# Patient Record
Sex: Male | Born: 1960 | Race: White | Hispanic: No | Marital: Married | Smoking: Never smoker
Health system: Southern US, Community
[De-identification: ages and names within clinical notes are randomized; demographics above are authoritative.]

## PROBLEM LIST (undated history)

## (undated) DIAGNOSIS — E162 Hypoglycemia, unspecified: Secondary | ICD-10-CM

## (undated) HISTORY — PX: VASECTOMY: SHX75

---

## 2011-01-05 ENCOUNTER — Emergency Department (HOSPITAL_COMMUNITY)
Admission: EM | Admit: 2011-01-05 | Discharge: 2011-01-05 | Disposition: A | Discharge status: Home / Self Care | Payer: PRIVATE HEALTH INSURANCE | Attending: Emergency Medicine | Admitting: Emergency Medicine

## 2011-01-05 ENCOUNTER — Emergency Department (HOSPITAL_COMMUNITY): Payer: PRIVATE HEALTH INSURANCE

## 2011-01-05 DIAGNOSIS — R4789 Other speech disturbances: Secondary | ICD-10-CM | POA: Insufficient documentation

## 2011-01-05 DIAGNOSIS — K219 Gastro-esophageal reflux disease without esophagitis: Secondary | ICD-10-CM | POA: Insufficient documentation

## 2011-01-05 DIAGNOSIS — R454 Irritability and anger: Secondary | ICD-10-CM | POA: Insufficient documentation

## 2011-01-05 DIAGNOSIS — R42 Dizziness and giddiness: Secondary | ICD-10-CM | POA: Insufficient documentation

## 2011-01-05 DIAGNOSIS — H538 Other visual disturbances: Secondary | ICD-10-CM | POA: Insufficient documentation

## 2011-01-05 DIAGNOSIS — I10 Essential (primary) hypertension: Secondary | ICD-10-CM | POA: Insufficient documentation

## 2011-01-05 LAB — CBC
HCT: 38.6 % — ABNORMAL LOW (ref 39.0–52.0)
Hemoglobin: 14.3 g/dL (ref 13.0–17.0)
MCH: 32.9 pg (ref 26.0–34.0)
MCHC: 37 g/dL — ABNORMAL HIGH (ref 30.0–36.0)
MCV: 88.9 fL (ref 78.0–100.0)
Platelets: 138 10*3/uL — ABNORMAL LOW (ref 150–400)
RBC: 4.34 MIL/uL (ref 4.22–5.81)
RDW: 11.9 % (ref 11.5–15.5)
WBC: 6.5 10*3/uL (ref 4.0–10.5)

## 2011-01-05 LAB — BASIC METABOLIC PANEL
BUN: 13 mg/dL (ref 6–23)
CO2: 26 mEq/L (ref 19–32)
Calcium: 9 mg/dL (ref 8.4–10.5)
Chloride: 108 mEq/L (ref 96–112)
Creatinine, Ser: 0.83 mg/dL (ref 0.4–1.5)
GFR calc Af Amer: >60 mL/min (ref >60)
GFR calc non Af Amer: >60 mL/min (ref >60)
Glucose, Bld: 99 mg/dL (ref 70–99)
Potassium: 3.9 mEq/L (ref 3.5–5.1)
Sodium: 139 mEq/L (ref 135–145)

## 2012-04-15 ENCOUNTER — Emergency Department (HOSPITAL_BASED_OUTPATIENT_CLINIC_OR_DEPARTMENT_OTHER): Payer: PRIVATE HEALTH INSURANCE

## 2012-04-15 ENCOUNTER — Emergency Department (HOSPITAL_BASED_OUTPATIENT_CLINIC_OR_DEPARTMENT_OTHER)
Admission: EM | Admit: 2012-04-15 | Discharge: 2012-04-15 | Disposition: A | Discharge status: Home / Self Care | Payer: PRIVATE HEALTH INSURANCE | Attending: Emergency Medicine | Admitting: Emergency Medicine

## 2012-04-15 ENCOUNTER — Encounter (HOSPITAL_BASED_OUTPATIENT_CLINIC_OR_DEPARTMENT_OTHER): Payer: Self-pay | Admitting: Family Medicine

## 2012-04-15 DIAGNOSIS — Z79899 Other long term (current) drug therapy: Secondary | ICD-10-CM | POA: Insufficient documentation

## 2012-04-15 DIAGNOSIS — M79606 Pain in leg, unspecified: Secondary | ICD-10-CM

## 2012-04-15 DIAGNOSIS — M79609 Pain in unspecified limb: Secondary | ICD-10-CM | POA: Insufficient documentation

## 2012-04-15 HISTORY — DX: Hypoglycemia, unspecified: E16.2

## 2012-04-15 LAB — BASIC METABOLIC PANEL
BUN: 19 mg/dL (ref 6–23)
CO2: 27 mEq/L (ref 19–32)
Calcium: 9.2 mg/dL (ref 8.4–10.5)
Chloride: 106 mEq/L (ref 96–112)
Creatinine, Ser: 0.8 mg/dL (ref 0.50–1.35)
GFR calc Af Amer: >90 mL/min (ref >90)
GFR calc non Af Amer: >90 mL/min (ref >90)
Glucose, Bld: 101 mg/dL — ABNORMAL HIGH (ref 70–99)
Potassium: 4.2 mEq/L (ref 3.5–5.1)
Sodium: 139 mEq/L (ref 135–145)

## 2012-04-15 LAB — GLUCOSE, CAPILLARY: Glucose-Capillary: 97 mg/dL (ref 70–99)

## 2012-04-15 MED ORDER — OXYCODONE-ACETAMINOPHEN 5-325 MG PO TABS
2.0000 | ORAL_TABLET | Freq: Once | ORAL | Status: AC
  Administered 2012-04-15: 2 via ORAL
  Filled 2012-04-15: qty 2

## 2012-04-15 MED ORDER — NAPROXEN 500 MG PO TABS: 500.0000 mg | ORAL_TABLET | Freq: Two times a day (BID) | ORAL | Status: AC

## 2012-04-15 MED ORDER — IOHEXOL 350 MG/ML SOLN
120.0000 mL | Freq: Once | INTRAVENOUS | Status: AC | PRN
  Administered 2012-04-15: 120 mL via INTRAVENOUS

## 2012-04-15 MED ORDER — TRAMADOL HCL 50 MG PO TABS: 50.0000 mg | ORAL_TABLET | Freq: Four times a day (QID) | ORAL | Status: AC | PRN

## 2012-04-15 NOTE — ED Notes (Signed)
Pt c/o right lower leg aching since 0830 this morning. Pt denies injury. Pt sts he used a testosterone cream on right hip last night for first time. Cms intact.

## 2012-04-15 NOTE — ED Provider Notes (Addendum)
History     CSN: [id number]  Arrival date & time 04/15/12  4098   First MD Initiated Contact with Patient 04/15/12 [phone]      Chief Complaint  Patient presents with  . Leg Pain    (Consider location/radiation/quality/duration/timing/severity/associated sxs/prior treatment) HPI Comments: Patient has had some achy pain to his right lower leg. This started this morning. He was walking on and felt some sharp pains for orders. Starting in his knee area and running down the back of his leg to his foot. He denies any unusual activities or injuries to his leg. Denies any numbness to his leg. Denies any history of ongoing pain in this leg in the past. Denies any swelling to the leg. He, says occasionally when he is walking, he has some pain behind his knee but no ongoing calf pain. He, says that last night, he put some testosterone cream on his right hip for the first time. He states that the pain is eased off at this point  The history is provided by the patient.    Past Medical History  Diagnosis Date  . Hypoglycemia     Past Surgical History  Procedure Date  . Vasectomy     No family history on file.  History  Substance Use Topics  . Smoking status: Never Smoker   . Smokeless tobacco: Not on file  . Alcohol Use: Yes      Review of Systems  Constitutional: Negative for fever, chills, diaphoresis and fatigue.  HENT: Negative for congestion, rhinorrhea and sneezing.   Eyes: Negative.   Respiratory: Negative for cough, chest tightness and shortness of breath.   Cardiovascular: Negative for chest pain and leg swelling.  Gastrointestinal: Negative for nausea, vomiting, abdominal pain, diarrhea and blood in stool.  Genitourinary: Negative for frequency, hematuria, flank pain and difficulty urinating.  Musculoskeletal: Negative for back pain and arthralgias.  Skin: Negative for rash.  Neurological: Negative for dizziness, speech difficulty, weakness, numbness and headaches.     Allergies  Prednisone  Home Medications   Current Outpatient Rx  Name Route Sig Dispense Refill  . METFORMIN HCL 500 MG PO TABS Oral Take 500 mg by mouth daily with breakfast.    . TESTOSTERONE 10 MG/ACT (2%) TD GEL Transdermal Place onto the skin.    Marland Kitchen NAPROXEN 500 MG PO TABS Oral Take 1 tablet (500 mg total) by mouth 2 (two) times daily. 30 tablet 0  . TRAMADOL HCL 50 MG PO TABS Oral Take 1 tablet (50 mg total) by mouth every 6 (six) hours as needed for pain. 15 tablet 0    BP 136/93  Pulse 77  Temp 97.9 F (36.6 C) (Oral)  Resp 16  Ht 6' (1.829 m)  Wt 204 lb (92.534 kg)  BMI 27.67 kg/m2  SpO2 98%  Physical Exam  Constitutional: He is oriented to person, place, and time. He appears well-developed and well-nourished.  HENT:  Head: Normocephalic and atraumatic.  Eyes: Pupils are equal, round, and reactive to light.  Neck: Normal range of motion. Neck supple.  Cardiovascular: Normal rate, regular rhythm and normal heart sounds.   Pulmonary/Chest: Effort normal and breath sounds normal. No respiratory distress. He has no wheezes. He has no rales. He exhibits no tenderness.  Abdominal: Soft. Bowel sounds are normal. There is no tenderness. There is no rebound and no guarding.  Musculoskeletal: Normal range of motion. He exhibits no edema.       He has some mild tenderness to palpation over  the right calf. There is no swelling to the leg. He has good pedal pulses. There is no cyanosis, or color, change in the foot. It is warm to touch. There is no signs of infection. There is no pain on palpation of the knee.  Lymphadenopathy:    He has no cervical adenopathy.  Neurological: He is alert and oriented to person, place, and time. He has normal strength. No sensory deficit.  Skin: Skin is warm and dry. No rash noted.  Psychiatric: He has a normal mood and affect.    ED Course  Procedures (including critical care time)  Results for orders placed during the hospital encounter  of 04/15/12  BASIC METABOLIC PANEL      Component Value Range   Sodium 139  135 - 145 mEq/L   Potassium 4.2  3.5 - 5.1 mEq/L   Chloride 106  96 - 112 mEq/L   CO2 27  19 - 32 mEq/L   Glucose, Bld 101 (*) 70 - 99 mg/dL   BUN 19  6 - 23 mg/dL   Creatinine, Ser 1.61  0.50 - 1.35 mg/dL   Calcium 9.2  8.4 - 09.6 mg/dL   GFR calc non Af Amer >90  >90 mL/min   GFR calc Af Amer >90  >90 mL/min  GLUCOSE, CAPILLARY      Component Value Range   Glucose-Capillary 97  70 - 99 mg/dL   Dg Tibia/fibula Right  04/15/2012  *RADIOLOGY REPORT*  Clinical Data: Leg pain, no injury  RIGHT TIBIA AND FIBULA - 2 VIEW  Comparison: None.  Findings: Negative for fracture.  No focal bony abnormality.  Mild spurring of the patella.  Otherwise no significant degenerative change in the knee or ankle.  IMPRESSION: No acute abnormality.  Original Report Authenticated By: Camelia Phenes, M.D.   Ct Angio Ao+bifem W/cm &/or Wo/cm  04/15/2012  *RADIOLOGY REPORT*  Clinical data: Right calf claudication  CT ANGIOGRAM ABDOMINAL AORTA AND BILATERAL LOWER EXTREMITIES WITH CONTRAST  Technique:  Axial helical CT of the abdominal aorta and bilateral lower extremities after Optiray 350 IV.  Coronal and sagittal reconstructions were generated for vascular evaluation.  Contrast: OMNIPAQUE IOHEXOL 350 MG/ML SOLN  Comparison:   None.  Arterial findings: Aorta:                  Mildly tortuous, without significant atheromatous irregularity.  No dissection, aneurysm, or stenosis.  Celiac axis:            Widely patent  Superior mesenteric:Widely patent, with classic distal branching anatomy.  Left renal:             Single, widely patent.  Right renal:            Minimal plaque just beyond the ostium without stenosis, widely patent distally.  Inferior mesenteric:Widely patent  Left iliac:             Unremarkable  Right iliac:            Unremarkable  Left lower extremity:  Profunda femoris, common femoral artery, SFA, popliteal, and  trifurcation runoff vessels widely patent.  Right lower extremity: Common femoral artery, profunda femoris, SFA, popliteal artery, and trifurcation runoff vessels widely patent.  Venous findings:  Venous phase imaging was not obtained.  Renal veins are noted to be patent bilaterally.   Review of the MIP images confirms the above findings.  Nonvascular findings: Unremarkable arterial phase evaluation of visualized portions of the liver  and spleen, adrenal glands, kidneys, pancreas.  The stomach is decompressed.  Small bowel nondilated.  Normal appendix.  The colon is nondistended with a few scattered small sigmoid diverticula; no adjacent inflammatory/edematous change.  Urinary bladder physiologically distended.  Mild prostatic enlargement with central coarse calcifications.  No ascites.  No free air.  No pelvic, retroperitoneal, or mesenteric adenopathy evident.  IMPRESSION:  1.  No evidence of   significant lower extremity   arterial occlusive disease. 2.  Ancillary findings as above.  Original Report Authenticated By: Osa Craver, M.D.   US Venous Img Lower Unilateral Right  04/15/2012  *RADIOLOGY REPORT*  Clinical Data: LEG PAIN;;  RIGHT LOWER EXTREMITY VENOUS DUPLEX ULTRASOUND  Technique: Gray-scale sonography with compression, as well as color and duplex ultrasound, were performed to evaluate the deep venous system from the level of the common femoral vein through the popliteal and proximal calf veins.  Comparison: None  Findings:  Normal compressibility and normal Doppler signal within the common femoral, superficial femoral and popliteal veins, down to the proximal calf veins.  No grayscale filling defects to suggest DVT.  IMPRESSION: No evidence of right lower extremity deep vein thrombosis.  Original Report Authenticated By: Cyndie Chime, M.D.    Date: 05/05/2012  Rate: 63  Rhythm: normal sinus rhythm  QRS Axis: normal  Intervals: normal  ST/T Wave abnormalities: normal  Conduction  Disutrbances:none  Narrative Interpretation:   Old EKG Reviewed: none available      1. Leg pain       MDM  Pt presented with sudden onset of right calf pain.  Some questionable claudiaction sounding symptoms.  No evidence of DVT, no evidence of arterial thrombus.  Likely musculoskeletal,        Rolan Bucco, MD 04/15/12 1610  Rolan Bucco, MD 05/05/12 1125

## 2013-07-02 ENCOUNTER — Emergency Department (HOSPITAL_COMMUNITY)
Admission: EM | Admit: 2013-07-02 | Discharge: 2013-07-02 | Disposition: A | Discharge status: Home / Self Care | Payer: PRIVATE HEALTH INSURANCE | Attending: Emergency Medicine | Admitting: Emergency Medicine

## 2013-07-02 ENCOUNTER — Emergency Department (HOSPITAL_COMMUNITY): Payer: PRIVATE HEALTH INSURANCE

## 2013-07-02 ENCOUNTER — Encounter (HOSPITAL_COMMUNITY): Payer: Self-pay | Admitting: Nurse Practitioner

## 2013-07-02 DIAGNOSIS — Z79899 Other long term (current) drug therapy: Secondary | ICD-10-CM | POA: Insufficient documentation

## 2013-07-02 DIAGNOSIS — Y92410 Unspecified street and highway as the place of occurrence of the external cause: Secondary | ICD-10-CM | POA: Insufficient documentation

## 2013-07-02 DIAGNOSIS — Y9389 Activity, other specified: Secondary | ICD-10-CM | POA: Insufficient documentation

## 2013-07-02 DIAGNOSIS — Z8639 Personal history of other endocrine, nutritional and metabolic disease: Secondary | ICD-10-CM | POA: Insufficient documentation

## 2013-07-02 DIAGNOSIS — S8990XA Unspecified injury of unspecified lower leg, initial encounter: Secondary | ICD-10-CM | POA: Insufficient documentation

## 2013-07-02 DIAGNOSIS — M25571 Pain in right ankle and joints of right foot: Secondary | ICD-10-CM

## 2013-07-02 DIAGNOSIS — Z862 Personal history of diseases of the blood and blood-forming organs and certain disorders involving the immune mechanism: Secondary | ICD-10-CM | POA: Insufficient documentation

## 2013-07-02 DIAGNOSIS — S99929A Unspecified injury of unspecified foot, initial encounter: Secondary | ICD-10-CM | POA: Insufficient documentation
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
## 2013-07-04 NOTE — ED Provider Notes (Signed)
Medical screening examination/treatment/procedure(s) were performed by non-physician practitioner and as supervising physician I was immediately available for consultation/collaboration.   Glynn Octave, MD 07/04/13 1640

## 2013-07-28 IMAGING — CT CT ANGIO AOBIFEM WO/W CM
2 of 12 series · 12 of 46 positions shown, 15 images · IV contrast (APPLIED)
Comparison: None.

CLINICAL DATA: Right calf claudication

CT ANGIOGRAM ABDOMINAL AORTA AND BILATERAL LOWER EXTREMITIES WITH
CONTRAST
TECHNIQUE: Axial helical CT of the abdominal aorta and bilateral
lower extremities after 120ml Optiray 350 IV.  Coronal and sagittal
reconstructions were generated for vascular evaluation.
Contrast: 120mL OMNIPAQUE IOHEXOL 350 MG/ML SOLN

[Series 5: celiac to knee 1.0 b25f · axial · 0.71mm/px · z∈[+470,+1146]mm · 10 of 1563 slices shown, 13 images]
[im 105/1563  soft-tissue]
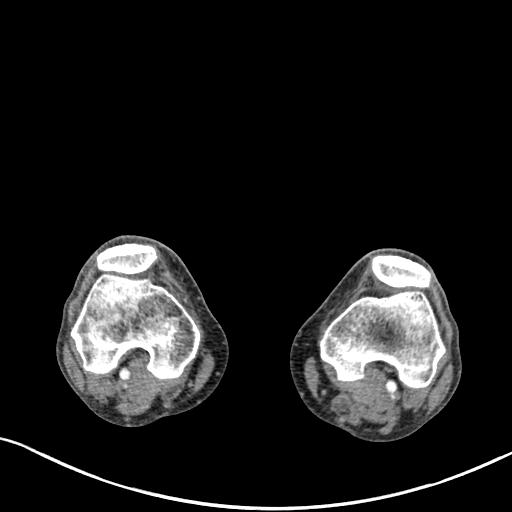
[im 105/1563  bone]
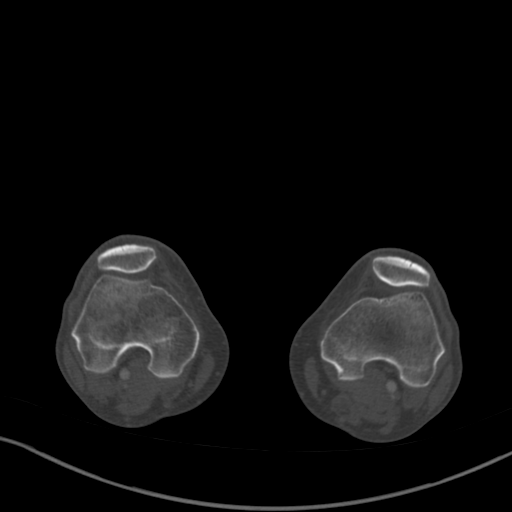
[im 313/1563  soft-tissue]
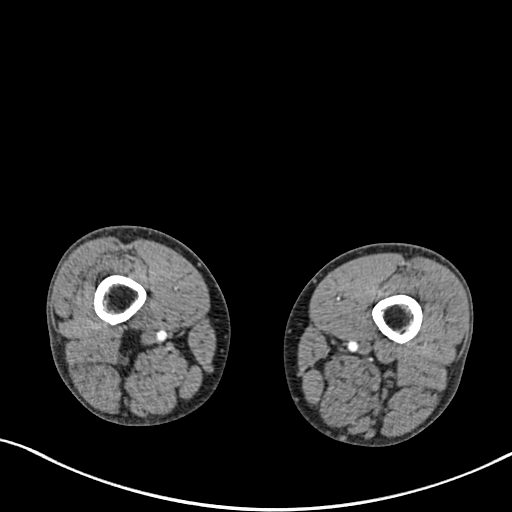
[im 521/1563  soft-tissue]
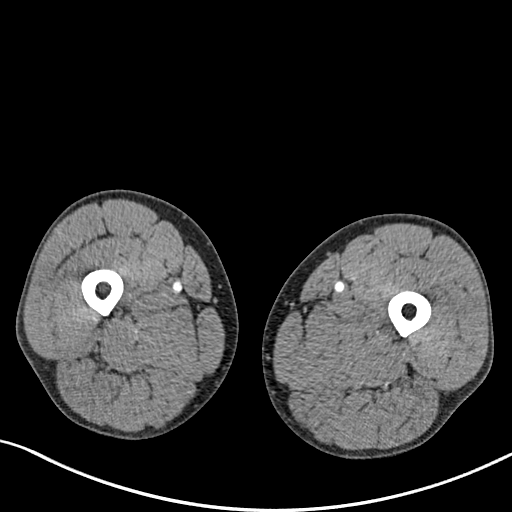
[im 729/1563  soft-tissue]
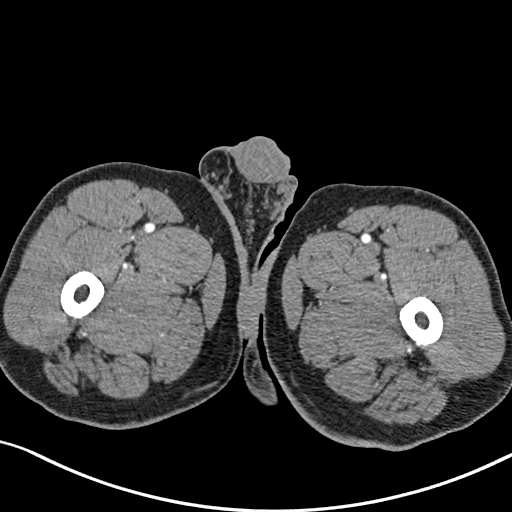
[im 834/1563  soft-tissue]
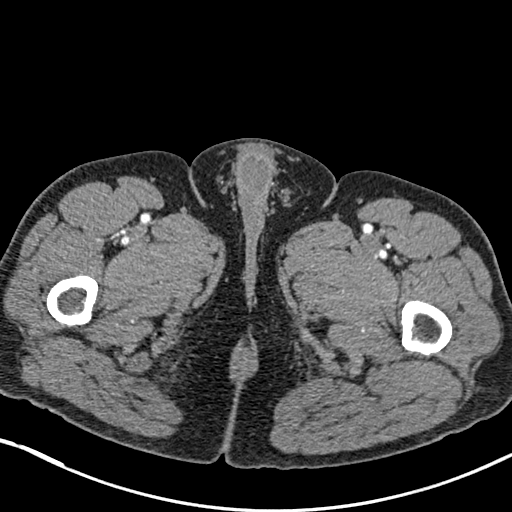
[im 1042/1563  soft-tissue]
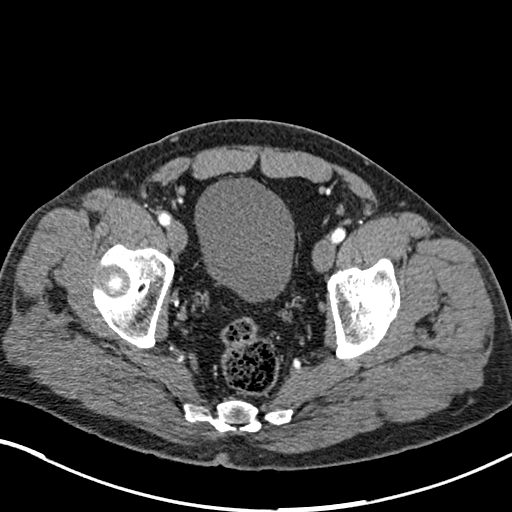
[im 1146/1563  lung]
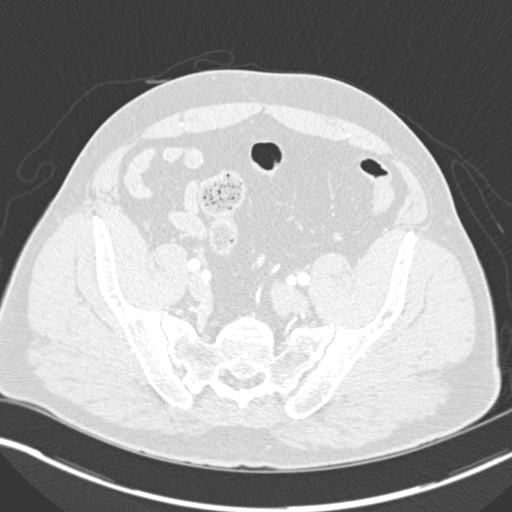
[im 1250/1563  soft-tissue]
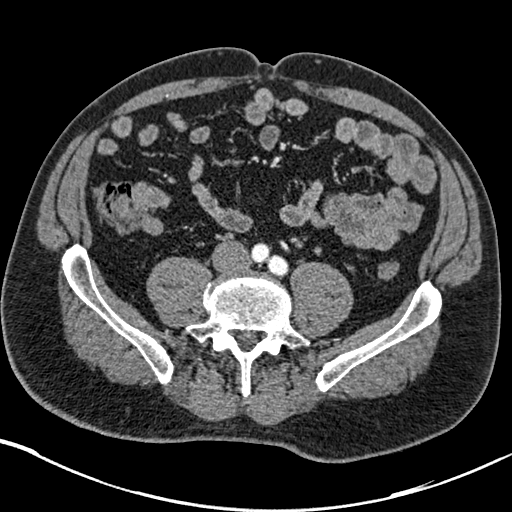
[im 1250/1563  lung]
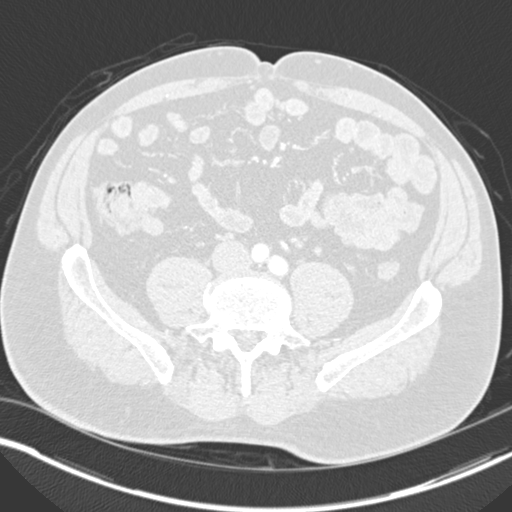
[im 1354/1563  lung]
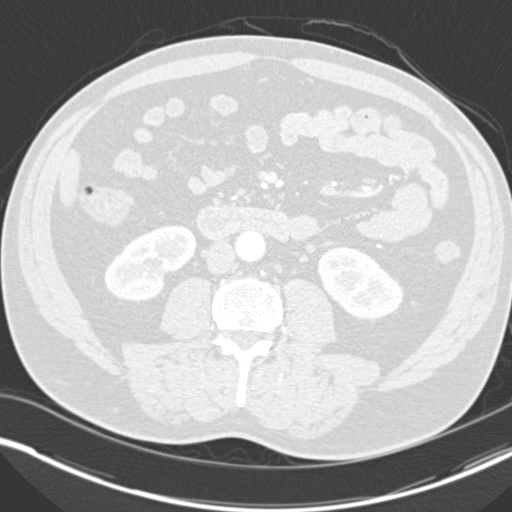
[im 1458/1563  soft-tissue]
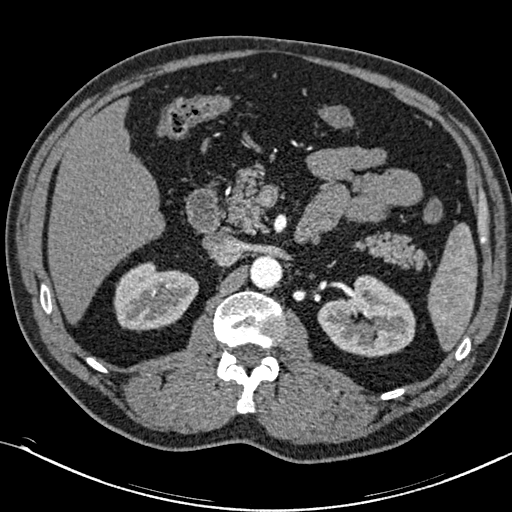
[im 1458/1563  lung]
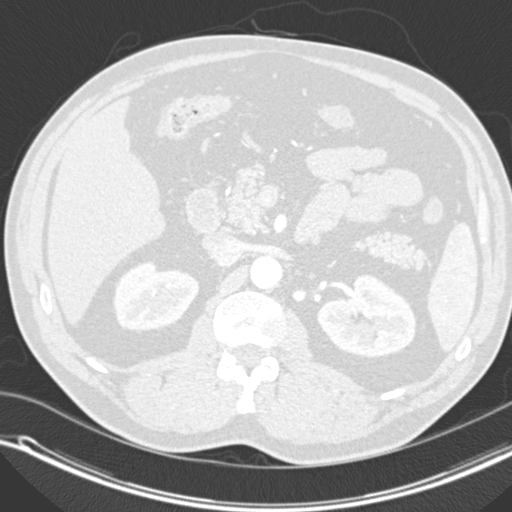

[Series 6: celiac to knee 2.0 coronal · coronal · 0.72mm/px · 2 of 186 slices shown]
[im 62/186  soft-tissue]
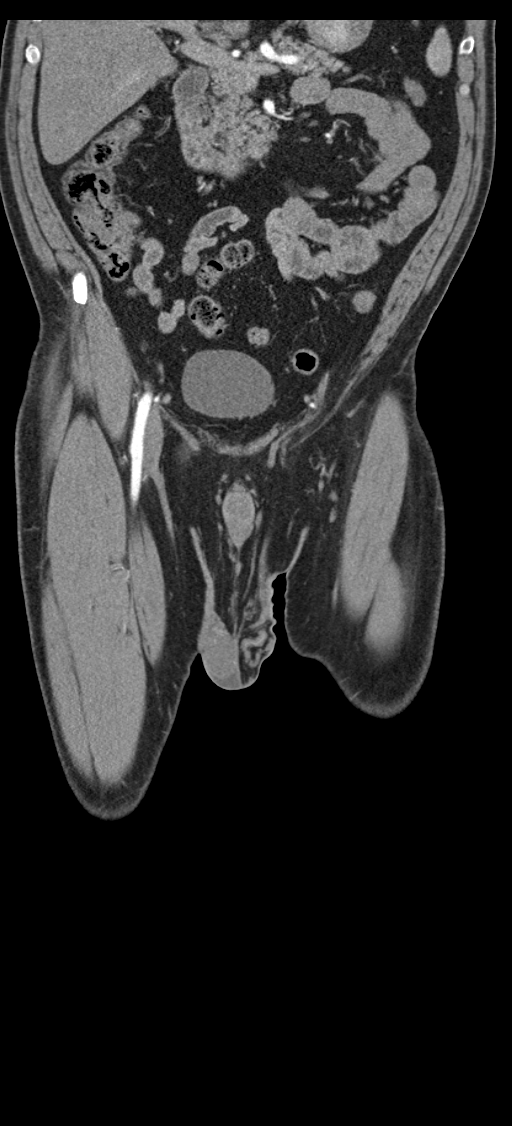
[im 124/186  soft-tissue]
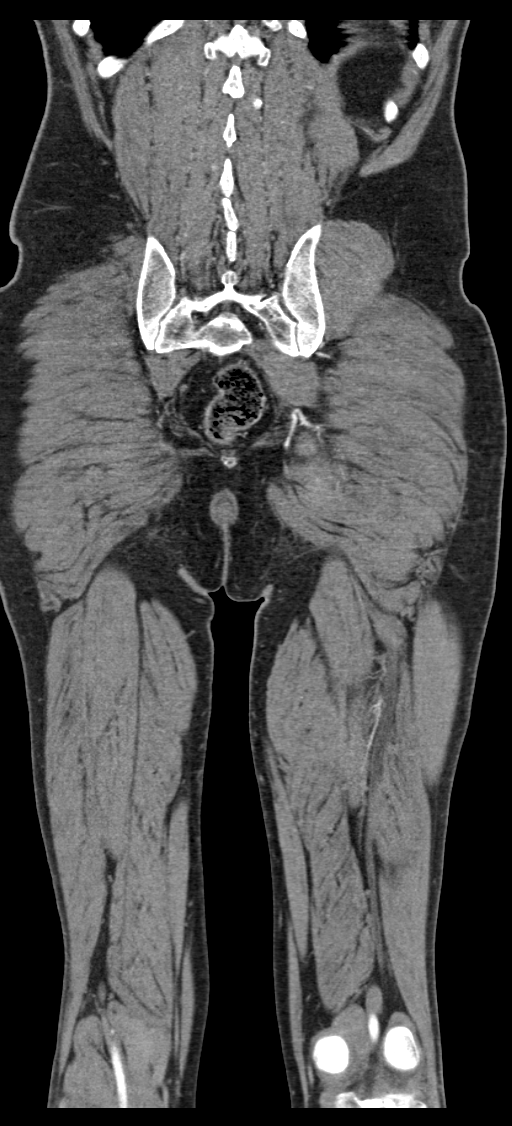

[12 of 46 positions shown; findings below may reference images not displayed]

Arterial findings:
Aorta:                  Mildly tortuous, without significant
atheromatous irregularity.  No dissection, aneurysm, or stenosis.

Celiac axis:            Widely patent

Superior mesenteric:Widely patent, with classic distal branching
anatomy.

Left renal:             Single, widely patent.

Right renal:            Minimal plaque just beyond the ostium
without stenosis, widely patent distally.

Inferior mesenteric:Widely patent

Left iliac:             Unremarkable

Right iliac:            Unremarkable

Left lower extremity:  Profunda femoris, common femoral artery,
SFA, popliteal, and trifurcation runoff vessels widely patent.

Right lower extremity: Common femoral artery, profunda femoris,
SFA, popliteal artery, and trifurcation runoff vessels widely
patent.

Venous findings:  Venous phase imaging was not obtained.  Renal
veins are noted to be patent bilaterally.

 Review of the MIP images confirms the above findings.

Nonvascular findings: Unremarkable arterial phase evaluation of
visualized portions of the liver and spleen, adrenal glands,
kidneys, pancreas.  The stomach is decompressed.  Small bowel
nondilated.  Normal appendix.  The colon is nondistended with a few
scattered small sigmoid diverticula; no adjacent
inflammatory/edematous change.  Urinary bladder physiologically
distended.  Mild prostatic enlargement with central coarse
calcifications.  No ascites.  No free air.  No pelvic,
retroperitoneal, or mesenteric adenopathy evident.
IMPRESSION: 1.  No evidence of   significant lower extremity   arterial
occlusive disease.
2.  Ancillary findings as above.

## 2015-05-04 ENCOUNTER — Encounter (HOSPITAL_COMMUNITY): Payer: Self-pay

## 2015-05-04 ENCOUNTER — Emergency Department (HOSPITAL_COMMUNITY)
Admission: EM | Admit: 2015-05-04 | Discharge: 2015-05-04 | Disposition: A | Discharge status: Home / Self Care | Payer: 59 | Attending: Emergency Medicine | Admitting: Emergency Medicine

## 2015-05-04 ENCOUNTER — Emergency Department (HOSPITAL_COMMUNITY): Payer: 59

## 2015-05-04 DIAGNOSIS — R110 Nausea: Secondary | ICD-10-CM | POA: Insufficient documentation

## 2015-05-04 DIAGNOSIS — Z8639 Personal history of other endocrine, nutritional and metabolic disease: Secondary | ICD-10-CM | POA: Insufficient documentation

## 2015-05-04 DIAGNOSIS — J069 Acute upper respiratory infection, unspecified: Secondary | ICD-10-CM | POA: Diagnosis not present

## 2015-05-04 DIAGNOSIS — R42 Dizziness and giddiness: Secondary | ICD-10-CM | POA: Diagnosis not present

## 2015-05-04 DIAGNOSIS — M62830 Muscle spasm of back: Secondary | ICD-10-CM | POA: Insufficient documentation

## 2015-05-04 DIAGNOSIS — R55 Syncope and collapse: Secondary | ICD-10-CM | POA: Insufficient documentation

## 2015-05-04 DIAGNOSIS — M546 Pain in thoracic spine: Secondary | ICD-10-CM | POA: Diagnosis present

## 2015-05-04 LAB — URINALYSIS, ROUTINE W REFLEX MICROSCOPIC
Bilirubin Urine: NEGATIVE
Glucose, UA: NEGATIVE mg/dL
Hgb urine dipstick: NEGATIVE
Ketones, ur: NEGATIVE mg/dL
Leukocytes, UA: NEGATIVE
Nitrite: NEGATIVE
Protein, ur: NEGATIVE mg/dL
Specific Gravity, Urine: 1.013 (ref 1.005–1.030)
Urobilinogen, UA: 1 mg/dL (ref 0.0–1.0)
pH: 6.5 (ref 5.0–8.0)

## 2015-05-04 LAB — CBC WITH DIFFERENTIAL/PLATELET
Basophils Absolute: 0 10*3/uL (ref 0.0–0.1)
Basophils Relative: 0 % (ref 0–1)
Eosinophils Absolute: 0 10*3/uL (ref 0.0–0.7)
Eosinophils Relative: 0 % (ref 0–5)
HCT: 44.2 % (ref 39.0–52.0)
Hemoglobin: 16 g/dL (ref 13.0–17.0)
Lymphocytes Relative: 13 % (ref 12–46)
Lymphs Abs: 1.2 10*3/uL (ref 0.7–4.0)
MCH: 32.9 pg (ref 26.0–34.0)
MCHC: 36.2 g/dL — ABNORMAL HIGH (ref 30.0–36.0)
MCV: 90.8 fL (ref 78.0–100.0)
Monocytes Absolute: 1 10*3/uL (ref 0.1–1.0)
Monocytes Relative: 11 % (ref 3–12)
Neutro Abs: 7.3 10*3/uL (ref 1.7–7.7)
Neutrophils Relative %: 76 % (ref 43–77)
Platelets: 129 10*3/uL — ABNORMAL LOW (ref 150–400)
RBC: 4.87 MIL/uL (ref 4.22–5.81)
RDW: 12 % (ref 11.5–15.5)
WBC: 9.6 10*3/uL (ref 4.0–10.5)

## 2015-05-04 LAB — COMPREHENSIVE METABOLIC PANEL
ALT: 29 U/L (ref 17–63)
AST: 26 U/L (ref 15–41)
Albumin: 3.7 g/dL (ref 3.5–5.0)
Alkaline Phosphatase: 74 U/L (ref 38–126)
Anion gap: 9 (ref 5–15)
BUN: 14 mg/dL (ref 6–20)
CO2: 25 mmol/L (ref 22–32)
Calcium: 9.3 mg/dL (ref 8.9–10.3)
Chloride: 104 mmol/L (ref 101–111)
Creatinine, Ser: 0.95 mg/dL (ref 0.61–1.24)
GFR calc Af Amer: >60 mL/min (ref >60)
GFR calc non Af Amer: >60 mL/min (ref >60)
Glucose, Bld: 120 mg/dL — ABNORMAL HIGH (ref 65–99)
Potassium: 4 mmol/L (ref 3.5–5.1)
Sodium: 138 mmol/L (ref 135–145)
Total Bilirubin: 1.4 mg/dL — ABNORMAL HIGH (ref 0.3–1.2)
Total Protein: 7 g/dL (ref 6.5–8.1)

## 2015-05-04 LAB — CBG MONITORING, ED: Glucose-Capillary: 117 mg/dL — ABNORMAL HIGH (ref 65–99)

## 2015-05-04 MED ORDER — ONDANSETRON HCL 4 MG/2ML IJ SOLN
4.0000 mg | Freq: Once | INTRAMUSCULAR | Status: AC
  Administered 2015-05-04: 4 mg via INTRAVENOUS
  Filled 2015-05-04: qty 2

## 2015-05-04 MED ORDER — MECLIZINE HCL 25 MG PO TABS
25.0000 mg | ORAL_TABLET | Freq: Once | ORAL | Status: AC
  Administered 2015-05-04: 25 mg via ORAL
  Filled 2015-05-04: qty 1

## 2015-05-04 MED ORDER — SODIUM CHLORIDE 0.9 % IV BOLUS (SEPSIS)
1000.0000 mL | Freq: Once | INTRAVENOUS | Status: AC
  Administered 2015-05-04: 1000 mL via INTRAVENOUS

## 2015-05-04 MED ORDER — KETOROLAC TROMETHAMINE 30 MG/ML IJ SOLN
30.0000 mg | Freq: Once | INTRAMUSCULAR | Status: AC
  Administered 2015-05-04: 30 mg via INTRAVENOUS
  Filled 2015-05-04: qty 1

## 2015-05-04 MED ORDER — NAPROXEN 250 MG PO TABS: 250.0000 mg | ORAL_TABLET | Freq: Two times a day (BID) | ORAL | Status: DC

## 2015-05-04 MED ORDER — MORPHINE SULFATE 4 MG/ML IJ SOLN
4.0000 mg | Freq: Once | INTRAMUSCULAR | Status: AC
  Administered 2015-05-04: 4 mg via INTRAVENOUS
  Filled 2015-05-04: qty 1

## 2015-05-04 MED ORDER — METHOCARBAMOL 500 MG PO TABS: 500.0000 mg | ORAL_TABLET | Freq: Two times a day (BID) | ORAL | Status: DC | PRN

## 2015-05-04 MED ORDER — ACETAMINOPHEN 500 MG PO TABS
1000.0000 mg | ORAL_TABLET | Freq: Once | ORAL | Status: AC
  Administered 2015-05-04: 1000 mg via ORAL
  Filled 2015-05-04: qty 2

## 2015-05-04 NOTE — Discharge Instructions (Signed)
Please get rest and drink 6-8 glasses of water per day.   Upper Respiratory Infection, Adult An upper respiratory infection (URI) is also sometimes known as the common cold. The upper respiratory tract includes the nose, sinuses, throat, trachea, and bronchi. Bronchi are the airways leading to the lungs. Most people improve within 1 week, but symptoms can last up to 2 weeks. A residual cough may last even longer.  CAUSES Many different viruses can infect the tissues lining the upper respiratory tract. The tissues become irritated and inflamed and often become very moist. Mucus production is also common. A cold is contagious. You can easily spread the virus to others by oral contact. This includes kissing, sharing a glass, coughing, or sneezing. Touching your mouth or nose and then touching a surface, which is then touched by another person, can also spread the virus. SYMPTOMS  Symptoms typically develop 1 to 3 days after you come in contact with a cold virus. Symptoms vary from person to person. They may include:  Runny nose.  Sneezing.  Nasal congestion.  Sinus irritation.  Sore throat.  Loss of voice (laryngitis).  Cough.  Fatigue.  Muscle aches.  Loss of appetite.  Headache.  Low-grade fever. DIAGNOSIS  You might diagnose your own cold based on familiar symptoms, since most people get a cold 2 to 3 times a year. Your caregiver can confirm this based on your exam. Most importantly, your caregiver can check that your symptoms are not due to another disease such as strep throat, sinusitis, pneumonia, asthma, or epiglottitis. Blood tests, throat tests, and X-rays are not necessary to diagnose a common cold, but they may sometimes be helpful in excluding other more serious diseases. Your caregiver will decide if any further tests are required. RISKS AND COMPLICATIONS  You may be at risk for a more severe case of the common cold if you smoke cigarettes, have chronic heart disease  (such as heart failure) or lung disease (such as asthma), or if you have a weakened immune system. The very young and very old are also at risk for more serious infections. Bacterial sinusitis, middle ear infections, and bacterial pneumonia can complicate the common cold. The common cold can worsen asthma and chronic obstructive pulmonary disease (COPD). Sometimes, these complications can require emergency medical care and may be life-threatening. PREVENTION  The best way to protect against getting a cold is to practice good hygiene. Avoid oral or hand contact with people with cold symptoms. Wash your hands often if contact occurs. There is no clear evidence that vitamin C, vitamin E, echinacea, or exercise reduces the chance of developing a cold. However, it is always recommended to get plenty of rest and practice good nutrition. TREATMENT  Treatment is directed at relieving symptoms. There is no cure. Antibiotics are not effective, because the infection is caused by a virus, not by bacteria. Treatment may include:  Increased fluid intake. Sports drinks offer valuable electrolytes, sugars, and fluids.  Breathing heated mist or steam (vaporizer or shower).  Eating chicken soup or other clear broths, and maintaining good nutrition.  Getting plenty of rest.  Using gargles or lozenges for comfort.  Controlling fevers with ibuprofen or acetaminophen as directed by your caregiver.  Increasing usage of your inhaler if you have asthma. Zinc gel and zinc lozenges, taken in the first 24 hours of the common cold, can shorten the duration and lessen the severity of symptoms. Pain medicines may help with fever, muscle aches, and throat pain. A variety  of non-prescription medicines are available to treat congestion and runny nose. Your caregiver can make recommendations and may suggest nasal or lung inhalers for other symptoms.  HOME CARE INSTRUCTIONS   Only take over-the-counter or prescription medicines  for pain, discomfort, or fever as directed by your caregiver.  Use a warm mist humidifier or inhale steam from a shower to increase air moisture. This may keep secretions moist and make it easier to breathe.  Drink enough water and fluids to keep your urine clear or pale yellow.  Rest as needed.  Return to work when your temperature has returned to normal or as your caregiver advises. You may need to stay home longer to avoid infecting others. You can also use a face mask and careful hand washing to prevent spread of the virus. SEEK MEDICAL CARE IF:   After the first few days, you feel you are getting worse rather than better.  You need your caregiver's advice about medicines to control symptoms.  You develop chills, worsening shortness of breath, or brown or red sputum. These may be signs of pneumonia.  You develop yellow or brown nasal discharge or pain in the face, especially when you bend forward. These may be signs of sinusitis.  You develop a fever, swollen neck glands, pain with swallowing, or white areas in the back of your throat. These may be signs of strep throat. SEEK IMMEDIATE MEDICAL CARE IF:   You have a fever.  You develop severe or persistent headache, ear pain, sinus pain, or chest pain.  You develop wheezing, a prolonged cough, cough up blood, or have a change in your usual mucus (if you have chronic lung disease).  You develop sore muscles or a stiff neck. Document Released: 03/31/2001 Document Revised: 12/28/2011 Document Reviewed: 01/10/2014 [facility] Patient Information 2015 [place], [place]. This information is not intended to replace advice given to you by your health care provider. Make sure you discuss any questions you have with your health care provider.  Back Exercises Back exercises help treat and prevent back injuries. The goal of back exercises is to increase the strength of your abdominal and back muscles and the flexibility of your back. These  exercises should be started when you no longer have back pain. Back exercises include:  Pelvic Tilt. Lie on your back with your knees bent. Tilt your pelvis until the lower part of your back is against the floor. Hold this position 5 to 10 sec and repeat 5 to 10 times.  Knee to Chest. Pull first 1 knee up against your chest and hold for 20 to 30 seconds, repeat this with the other knee, and then both knees. This may be done with the other leg straight or bent, whichever feels better.  Sit-Ups or Curl-Ups. Bend your knees 90 degrees. Start with tilting your pelvis, and do a partial, slow sit-up, lifting your trunk only 30 to 45 degrees off the floor. Take at least 2 to 3 seconds for each sit-up. Do not do sit-ups with your knees out straight. If partial sit-ups are difficult, simply do the above but with only tightening your abdominal muscles and holding it as directed.  Hip-Lift. Lie on your back with your knees flexed 90 degrees. Push down with your feet and shoulders as you raise your hips a couple inches off the floor; hold for 10 seconds, repeat 5 to 10 times.  Back arches. Lie on your stomach, propping yourself up on bent elbows. Slowly press on your hands, causing  an arch in your low back. Repeat 3 to 5 times. Any initial stiffness and discomfort should lessen with repetition over time.  Shoulder-Lifts. Lie face down with arms beside your body. Keep hips and torso pressed to floor as you slowly lift your head and shoulders off the floor. Do not overdo your exercises, especially in the beginning. Exercises may cause you some mild back discomfort which lasts for a few minutes; however, if the pain is more severe, or lasts for more than 15 minutes, do not continue exercises until you see your caregiver. Improvement with exercise therapy for back problems is slow.  See your caregivers for assistance with developing a proper back exercise program. Document Released: 11/12/2004 Document Revised:  12/28/2011 Document Reviewed: 08/06/2011 [facility] Patient Information 2015 [place], [place]. This information is not intended to replace advice given to you by your health care provider. Make sure you discuss any questions you have with your health care provider. Back Pain, Adult Low back pain is very common. About 1 in 5 people have back pain.The cause of low back pain is rarely dangerous. The pain often gets better over time.About half of people with a sudden onset of back pain feel better in just 2 weeks. About 8 in 10 people feel better by 6 weeks.  CAUSES Some common causes of back pain include:  Strain of the muscles or ligaments supporting the spine.  Wear and tear (degeneration) of the spinal discs.  Arthritis.  Direct injury to the back. DIAGNOSIS Most of the time, the direct cause of low back pain is not known.However, back pain can be treated effectively even when the exact cause of the pain is unknown.Answering your caregiver's questions about your overall health and symptoms is one of the most accurate ways to make sure the cause of your pain is not dangerous. If your caregiver needs more information, he or she may order lab work or imaging tests (X-rays or MRIs).However, even if imaging tests show changes in your back, this usually does not require surgery. HOME CARE INSTRUCTIONS For many people, back pain returns.Since low back pain is rarely dangerous, it is often a condition that people can learn to [facility] their own.   Remain active. It is stressful on the back to sit or stand in one place. Do not sit, drive, or stand in one place for more than 30 minutes at a time. Take short walks on level surfaces as soon as pain allows.Try to increase the length of time you walk each day.  Do not stay in bed.Resting more than 1 or 2 days can delay your recovery.  Do not avoid exercise or work.Your body is made to move.It is not dangerous to be active, even though your back may  hurt.Your back will likely heal faster if you return to being active before your pain is gone.  Pay attention to your body when you bend and lift. Many people have less discomfortwhen lifting if they bend their knees, keep the load close to their bodies,and avoid twisting. Often, the most comfortable positions are those that put less stress on your recovering back.  Find a comfortable position to sleep. Use a firm mattress and lie on your side with your knees slightly bent. If you lie on your back, put a pillow under your knees.  Only take over-the-counter or prescription medicines as directed by your caregiver. Over-the-counter medicines to reduce pain and inflammation are often the most helpful.Your caregiver may prescribe muscle relaxant drugs.These medicines help  dull your pain so you can more quickly return to your normal activities and healthy exercise.  Put ice on the injured area.  Put ice in a plastic bag.  Place a towel between your skin and the bag.  Leave the ice on for 15-20 minutes, 03-04 times a day for the first 2 to 3 days. After that, ice and heat may be alternated to reduce pain and spasms.  Ask your caregiver about trying back exercises and gentle massage. This may be of some benefit.  Avoid feeling anxious or stressed.Stress increases muscle tension and can worsen back pain.It is important to recognize when you are anxious or stressed and learn ways to manage it.Exercise is a great option. SEEK MEDICAL CARE IF:  You have pain that is not relieved with rest or medicine.  You have pain that does not improve in 1 week.  You have new symptoms.  You are generally not feeling well. SEEK IMMEDIATE MEDICAL CARE IF:   You have pain that radiates from your back into your legs.  You develop new bowel or bladder control problems.  You have unusual weakness or numbness in your arms or legs.  You develop nausea or vomiting.  You develop abdominal pain.  You  feel faint. Document Released: 10/05/2005 Document Revised: 04/05/2012 Document Reviewed: 02/06/2014 [facility] Patient Information 2015 [place]. This information is not intended to replace advice given to you by your health care provider. Make sure you discuss any questions you have with your health care provider. Near-Syncope Near-syncope (commonly known as near fainting) is sudden weakness, dizziness, or feeling like you might pass out. During an episode of near-syncope, you may also develop pale skin, have tunnel vision, or feel sick to your stomach (nauseous). Near-syncope may occur when getting up after sitting or while standing for a long time. It is caused by a sudden decrease in blood flow to the brain. This decrease can result from various causes or triggers, most of which are not serious. However, because near-syncope can sometimes be a sign of something serious, a medical evaluation is required. The specific cause is often not determined. HOME CARE INSTRUCTIONS  Monitor your condition for any changes. The following actions may help to alleviate any discomfort you are experiencing:  Have someone stay with you until you feel stable.  Lie down right away and prop your feet up if you start feeling like you might faint. Breathe deeply and steadily. Wait until all the symptoms have passed. Most of these episodes last only a few minutes. You may feel tired for several hours.   Drink enough fluids to keep your urine clear or pale yellow.   If you are taking blood pressure or heart medicine, get up slowly when seated or lying down. Take several minutes to sit and then stand. This can reduce dizziness.  Follow up with your health care provider as directed. SEEK IMMEDIATE MEDICAL CARE IF:   You have a severe headache.   You have unusual pain in the chest, abdomen, or back.   You are bleeding from the mouth or rectum, or you have black or tarry stool.   You have an irregular or  very fast heartbeat.   You have repeated fainting or have seizure-like jerking during an episode.   You faint when sitting or lying down.   You have confusion.   You have difficulty walking.   You have severe weakness.   You have vision problems.  MAKE SURE YOU:  Understand these instructions.  Will watch your condition.  Will get help right away if you are not doing well or get worse. Document Released: 10/05/2005 Document Revised: 10/10/2013 Document Reviewed: 03/10/2013 [facility] Patient Information 2015 [place], [place]. This information is not intended to replace advice given to you by your health care provider. Make sure you discuss any questions you have with your health care provider.

## 2015-05-04 NOTE — ED Notes (Signed)
Patient back from  X-ray 

## 2015-05-04 NOTE — ED Notes (Signed)
Flu swab @ PCP yesterday, negative.  No other diagnosis, pending labs.

## 2015-05-04 NOTE — ED Notes (Signed)
Patient transported to X-ray 

## 2015-05-04 NOTE — ED Notes (Addendum)
Pt reports 1 week of feeling "sugar is not right", 3 days headache, chills, body aches, lightheadedness-worse when standing, mild cough.  Onset last night mid right muscle spasm, pt thinks from laying in bed x 3 days.  No nausea or lightheadedness at this time.  Onset this morning while taking shower pt felt lightheadedness and felt like he was going to pass out, had to sit down in shower, dry heaves, no vomiting.  Pt took Ibuprofen 400 mg @ 7am.  Pt went to PCP yesterday, flu test, EKG and labs done.

## 2015-05-04 NOTE — ED Provider Notes (Signed)
Medical screening examination/treatment/procedure(s) were conducted as a shared visit with non-physician practitioner(s) and myself.  I personally evaluated the patient during the encounter.   EKG Interpretation   Date/Time:  Saturday May 04 2015 11:19:23 EDT Ventricular Rate:  97 PR Interval:  153 QRS Duration: 92 QT Interval:  316 QTC Calculation: 401 R Axis:   84 Text Interpretation:  Sinus rhythm Confirmed by Freida BusmanALLEN  MD, Erling Arrazola ([id number])  on 05/04/2015 3:30:07 PM     Patient here with myalgias and cephalgia. Seen by his physician yesterday and diagnosed with viral illness. Denies any photophobia. Has had nausea no vomiting. Headache has been at the base of his neck goes to his right frontal region. Neurological exam is nonfocal. Do not think that this represents subarachnoid or meningitis. We'll treat symptomatically   Lorre NickAnthony Ruairi Stutsman, MD 05/04/15 1531

## 2015-05-04 NOTE — ED Notes (Signed)
Pt with c/o lightheadedness.

## 2015-05-04 NOTE — ED Provider Notes (Signed)
CSN: [id number]     Arrival date & time 05/04/15  1107 History   First MD Initiated Contact with Patient 05/04/15 1110     Chief Complaint  Patient presents with  . Near Syncope  . Back Pain   Juan Holt is a 54 y.o. male with a history of reactive hypoglycemia who presents to the ED complaining of 3 days of just not feeling well with associated body aches, chills, subjective fever, mild cough, headache, and lightheadedness. He reports positional lightheadedness today and had a near syncopal episode while in the shower today. He denies any prodromal symptoms of chest pain or shortness of breath. He reports nausea after his near syncope and had dry heaving but no vomiting. He denies current lightheadedness, nausea or abdominal pain. He reports subjective fever for 3 days. He went to urgent care yesterday because he thought he had the flu, but his flu test was negative. He complains of a right mid-back spasm since last night which he attributes to lying in bed for the past 3 days. He reports that his back pain is worse with movement. He rates his pain at 8/10. He reports a 6/10 generalized headache. He reports taking 40 mg of ibuprofen at 7 AM this morning. The patient denies vomiting, rashes, insect bites, tick bites, syncope, chest pain, shortness of breath, abdominal pain, diarrhea, dysuria, neck stiffness, numbness, tingling, weakness, LOC, sore throat or ear pain. He denies room spinning dizziness. He denies loss of bowel or bladder control. He denies difficulty urinating. He denies history of cancer or IV drug use.  (Consider location/radiation/quality/duration/timing/severity/associated sxs/prior Treatment) HPI  Past Medical History  Diagnosis Date  . Hypoglycemia    Past Surgical History  Procedure Laterality Date  . Vasectomy     History reviewed. No pertinent family history. History  Substance Use Topics  . Smoking status: Never Smoker   . Smokeless tobacco: Not on file  . Alcohol  Use: Yes     Comment: rare    Review of Systems  Constitutional: Positive for fever, chills and fatigue.  HENT: Negative for congestion, ear pain, rhinorrhea, sore throat and trouble swallowing.   Eyes: Negative for photophobia, pain and visual disturbance.  Respiratory: Positive for cough. Negative for chest tightness, shortness of breath and wheezing.   Cardiovascular: Negative for chest pain and palpitations.  Gastrointestinal: Positive for nausea. Negative for vomiting, abdominal pain, diarrhea and blood in stool.  Genitourinary: Negative for dysuria, urgency, frequency, hematuria and difficulty urinating.  Musculoskeletal: Positive for arthralgias. Negative for back pain, neck pain and neck stiffness.  Skin: Negative for color change, rash and wound.  Neurological: Positive for light-headedness and headaches. Negative for dizziness, syncope, weakness and numbness.      Allergies  Prednisone  Home Medications   Prior to Admission medications   Medication Sig Start Date End Date Taking? Authorizing Provider  naproxen sodium (ANAPROX) 220 MG tablet Take 440 mg by mouth daily as needed (for pain).    Historical Provider, MD   BP 141/71 mmHg  Pulse 97  Temp(Src) 98.4 F (36.9 C) (Oral)  Resp 18  Ht 6' (1.829 m)  Wt 206 lb (93.441 kg)  BMI 27.93 kg/m2  SpO2 97% Physical Exam  Constitutional: He is oriented to person, place, and time. He appears well-developed and well-nourished. No distress.  Nontoxic appearing.  HENT:  Head: Normocephalic and atraumatic.  Mouth/Throat: Oropharynx is clear and moist. No oropharyngeal exudate.  Eyes: Conjunctivae are normal. Pupils are equal, round, and  reactive to light. Right eye exhibits no discharge. Left eye exhibits no discharge.  Neck: Normal range of motion. Neck supple. No JVD present. No tracheal deviation present.  Cardiovascular: Normal rate, regular rhythm, normal heart sounds and intact distal pulses.  Exam reveals no gallop  and no friction rub.   No murmur heard. Bilateral radial, posterior tibialis and dorsalis pedis pulses are intact.    Pulmonary/Chest: Effort normal and breath sounds normal. No respiratory distress. He has no wheezes. He has no rales.  Lungs are clear to auscultation bilaterally.  Abdominal: Soft. Bowel sounds are normal. He exhibits no distension. There is no tenderness. There is no rebound and no guarding.  Abdomen is soft and nontender to palpation. Bowel sounds are present.  Musculoskeletal: He exhibits no edema.  Right mid back around his paraspinous muscles is tender to palpation and reproduces his pain. No midline back tenderness. No back erythema, edema or ecchymosis. Patient is spontaneously moving all extremities in a coordinated fashion exhibiting good strength. No lower extremity edema or tenderness.  Lymphadenopathy:    He has no cervical adenopathy.  Neurological: He is alert and oriented to person, place, and time. No cranial nerve deficit. Coordination normal.  Sensation is intact in his bilateral upper and lower extremities. He is alert and oriented 3. Cranial nerves are intact. Finger to nose intact bilaterally. No pronator drift. EOMs are intact bilaterally. Speech is clear and coherent.   Skin: Skin is warm and dry. No rash noted. He is not diaphoretic. No erythema. No pallor.  Psychiatric: He has a normal mood and affect. His behavior is normal.  Nursing note and vitals reviewed.   ED Course  Procedures (including critical care time) Labs Review Labs Reviewed  COMPREHENSIVE METABOLIC PANEL  CBC WITH DIFFERENTIAL/PLATELET  URINALYSIS, ROUTINE W REFLEX MICROSCOPIC (NOT AT [facility])    Imaging Review No results found.   EKG Interpretation None      Filed Vitals:   05/04/15 1145 05/04/15 1345 05/04/15 1401 05/04/15 1500  BP: 129/82 124/70 118/80 125/83  Pulse: 98 80 78 60  Temp:      TempSrc:      Resp: 24 20  20   Height:      Weight:      SpO2: 98% 97% 98%  98%     MDM   Meds given in ED:  Medications  sodium chloride 0.9 % bolus 1,000 mL (0 mLs Intravenous Stopped 05/04/15 1349)  acetaminophen (TYLENOL) tablet 1,000 mg (1,000 mg Oral Given 05/04/15 1153)  meclizine (ANTIVERT) tablet 25 mg (25 mg Oral Given 05/04/15 1435)  sodium chloride 0.9 % bolus 1,000 mL (1,000 mLs Intravenous New Bag/Given 05/04/15 1435)  ketorolac (TORADOL) 30 MG/ML injection 30 mg (30 mg Intravenous Given 05/04/15 1542)  morphine 4 MG/ML injection 4 mg (4 mg Intravenous Given 05/04/15 1542)  ondansetron (ZOFRAN) injection 4 mg (4 mg Intravenous Given 05/04/15 1542)    New Prescriptions   METHOCARBAMOL (ROBAXIN) 500 MG TABLET    Take 1 tablet (500 mg total) by mouth 2 (two) times daily as needed for muscle spasms.   NAPROXEN (NAPROSYN) 250 MG TABLET    Take 1 tablet (250 mg total) by mouth 2 (two) times daily with a meal.    Final diagnoses:  Upper respiratory infection, viral  Near syncope  Back muscle spasm    This is a 54 y.o. male with a history of reactive hypoglycemia who presents to the ED complaining of 3 days of just not feeling  well with associated body aches, chills, subjective fever, mild cough, headache, and lightheadedness. He reports positional lightheadedness today and had a near syncopal episode while in the shower today. He denies any prodromal symptoms of chest pain or shortness of breath. He reports nausea after his near syncope and had dry heaving but no vomiting. He denies current lightheadedness, nausea or abdominal pain. He reports subjective fever for 3 days. He went to urgent care yesterday because he thought he had the flu, but his flu test was negative. He complains of a right mid-back spasm since last night which he attributes to lying in bed for the past 3 days. He reports that his back pain is worse with movement.  On exam the patient is afebrile and nontoxic appearing. His lungs are clear to auscultation bilaterally. His abdomen is soft and  nontender to palpation. The patient's right paraspinous muscles are tender to palpation reproduces his back pain. No midline neck or back tenderness. No meningeal signs. He has no focal neurological deficits. It is unclear if his dizziness is more room spinning or lightheadedness. We'll provide the patient with fluid bolus, meclizine and Tylenol. His urinalysis is unremarkable. CBC shows a normal white count. CMP is unremarkable. Chest x-ray is unremarkable.  At second reevaluation patient is still having back spasm but reports his headache is improving. Patient given Toradol and morphine.  At reevaluation the patient reports his headache and back spasm have completely resolved. He reports his lightheadedness has resolved. He is able to ambulate in the room without difficulty or assistance. He denies feeling lightheaded or dizzy with ambulation. He has tolerated water and a sandwich in the ED prior to discharge. Will discharge with prescriptions for naproxen and Robaxin. I advised him to follow-up with his primary care provider is next week. Strict return precautions provided. I advised the patient to follow-up with their primary care provider this week. I advised the patient to return to the emergency department with new or worsening symptoms or new concerns. The patient verbalized understanding and agreement with plan.    This patient was discussed with and evaluated by Dr. Freida Busman who agrees with assessment and plan.   Everlene Farrier, PA-C 05/04/15 1724

## 2015-05-05 ENCOUNTER — Emergency Department (INDEPENDENT_AMBULATORY_CARE_PROVIDER_SITE_OTHER)
Admission: EM | Admit: 2015-05-05 | Discharge: 2015-05-05 | Disposition: A | Discharge status: Home / Self Care | Payer: PRIVATE HEALTH INSURANCE | Source: Home / Self Care | Attending: Family Medicine | Admitting: Family Medicine

## 2015-05-05 DIAGNOSIS — R0789 Other chest pain: Secondary | ICD-10-CM

## 2015-05-05 LAB — POCT CBC W AUTO DIFF (K'VILLE URGENT CARE)

## 2015-05-05 LAB — POCT URINALYSIS DIPSTICK
Bilirubin, UA: NEGATIVE
Glucose, UA: NEGATIVE
Ketones, UA: NEGATIVE
Leukocytes, UA: NEGATIVE
Nitrite, UA: NEGATIVE
Protein, UA: NEGATIVE
Spec Grav, UA: 1.02
Urobilinogen, UA: 2
pH, UA: 7

## 2015-05-05 MED ORDER — TRAMADOL HCL 50 MG PO TABS: ORAL_TABLET | ORAL | Status: DC

## 2015-05-05 MED ORDER — VALACYCLOVIR HCL 1 G PO TABS: 1000.0000 mg | ORAL_TABLET | Freq: Three times a day (TID) | ORAL | Status: DC

## 2015-05-05 MED ORDER — LIDOCAINE 5 % EX PTCH: 1.0000 | MEDICATED_PATCH | CUTANEOUS | Status: DC

## 2015-05-05 NOTE — Discharge Instructions (Signed)
May continue Naproxen and Robaxin as needed.

## 2015-05-05 NOTE — ED Notes (Signed)
Patient states that he was seen yesterday in the ER states that he is having back pain today, states test were run but he was not given any results, states pain is lower back , radiates right side

## 2015-05-05 NOTE — ED Provider Notes (Signed)
CSN: [id number]     Arrival date & time 05/05/15  1154 History   None    Chief Complaint  Juan Holt presents with  . Back Pain      HPI Comments: Three days ago Juan Holt developed flu-like symptoms including fever, chills, myalgias, and arthralgias but no cough or nasal congestion.  He had some light-headedness but no loss of consciousness, and nausea without vomiting.  The next day he visited his PCP where a flu test was negative, and he was diagnosed with a viral illness.  Yesterday his symptoms persisted with the addition of right back and chest pain that he describes as pressure-like, itching, burning.  The pain is somewhat pluritic, but he does not have a cough.  No rash.  He presented to the [facility] ED where chest X-ray, EKG, CBC, urinalysis, and CMP were unremarkable.  His pain improved after receiving Toradol, morphine, and ondansetron injections.  Today his right lateral chest and back pain are worse, and minimally decreased with Robaxin and Naproxin.    Juan Holt is a 54 y.o. male presenting with back pain. The history is provided by the Juan Holt.  Back Pain Location:  Thoracic spine Quality:  Aching and burning Radiates to:  Does not radiate Pain severity:  Moderate Pain is:  Same all the time Onset quality:  Gradual Duration:  3 days Timing:  Constant Progression:  Worsening Chronicity:  New Context: not recent illness and not recent injury   Relieved by:  Nothing Worsened by:  Coughing, deep breathing, movement and palpation Ineffective treatments:  NSAIDs and muscle relaxants Associated symptoms: headaches   Associated symptoms: no abdominal pain, no dysuria, no fever, no leg pain, no numbness, no paresthesias and no tingling     Past Medical History  Diagnosis Date  . Hypoglycemia    Past Surgical History  Procedure Laterality Date  . Vasectomy     History reviewed. No pertinent family history. History  Substance Use Topics  . Smoking status: Never Smoker    . Smokeless tobacco: Not on file  . Alcohol Use: Yes     Comment: rare    Review of Systems  Constitutional: Negative for fever.  Gastrointestinal: Negative for abdominal pain.  Genitourinary: Negative for dysuria.  Musculoskeletal: Positive for back pain.  Skin: Negative for rash.  Neurological: Positive for headaches. Negative for tingling, numbness and paresthesias.    Allergies  Prednisone  Home Medications   Prior to Admission medications   Medication Sig Start Date End Date Taking? Authorizing Provider  acarbose (PRECOSE) 25 MG tablet Take 25 mg by mouth 3 (three) times daily with meals.   Yes Historical Provider, MD  cholecalciferol (VITAMIN D) 1000 UNITS tablet Take 2,000 Units by mouth daily.   Yes Historical Provider, MD  ibuprofen (ADVIL,MOTRIN) 200 MG tablet Take 400 mg by mouth every 6 (six) hours as needed for moderate pain.   Yes Historical Provider, MD  methocarbamol (ROBAXIN) 500 MG tablet Take 1 tablet (500 mg total) by mouth 2 (two) times daily as needed for muscle spasms. 05/04/15  Yes Everlene Farrier, PA-C  Multiple Vitamins-Minerals (MULTIVITAMIN PO) Take 1 tablet by mouth daily.   Yes Historical Provider, MD  naproxen (NAPROSYN) 250 MG tablet Take 1 tablet (250 mg total) by mouth 2 (two) times daily with a meal. 05/04/15  Yes Everlene Farrier, PA-C  lidocaine (LIDODERM) 5 % Place 1 patch onto the skin daily. Remove & Discard patch within 12 hours or as directed by MD 05/05/15  Lattie HawStephen A Athea Haley, MD  traMADol Janean Sark(ULTRAM) 50 MG tablet Take one or two tabs PO Q4 to 6hr prn pain 05/05/15   Lattie HawStephen A Jailah Willis, MD  valACYclovir (VALTREX) 1000 MG tablet Take 1 tablet (1,000 mg total) by mouth 3 (three) times daily. 05/05/15   Lattie HawStephen A Zailynn Brandel, MD   BP 126/81 mmHg  Pulse 72  Temp(Src) 98.4 F (36.9 C)  Ht 6' (1.829 m)  Wt 206 lb (93.441 kg)  BMI 27.93 kg/m2  SpO2 97% Physical Exam  Constitutional: He is oriented to person, place, and time. He appears well-developed and  well-nourished. No distress.  HENT:  Head: Normocephalic.  Right Ear: External ear normal.  Left Ear: External ear normal.  Nose: Nose normal.  Mouth/Throat: Oropharynx is clear and moist.  Eyes: Conjunctivae are normal. Pupils are equal, round, and reactive to light.  Neck: Neck supple.  Cardiovascular: Normal heart sounds.   Pulmonary/Chest: Breath sounds normal. No respiratory distress. He has no wheezes. He has no rales.   He exhibits tenderness.  Juan Holt's right posterior-lateral chest is tender to palpation in distribution as noted on diagram.  No rash is present, but the area is slightly erythematous.    Abdominal: Bowel sounds are normal. There is tenderness.  Musculoskeletal: He exhibits no edema.  Lymphadenopathy:    He has no cervical adenopathy.  Neurological: He is alert and oriented to person, place, and time.  Skin: Skin is warm and dry. He is not diaphoretic.  Nursing note and vitals reviewed.   ED Course  Procedures  None    Labs Reviewed  POCT CBC W AUTO DIFF ([facility]):  WBC 6.8; LY 23.5; MO 8.7; GR 67.8; Hgb 14.6; Platelets 132   POCT URINALYSIS DIPSTICK - Abnormal; Notable for the following:  BLO trace intact; otherwise negative         MDM   1. Posterior chest pain    Suspect early herpes zoster.  Begin empiric Valtrex 1000mg  TID.  Rx for lidocaine patch at bedtime. Ultram 50mg  for pain. May continue Naproxen and Robaxin as needed. Followup with Family Doctor in 3 days. If symptoms become significantly worse during the night or over the weekend, proceed to the local emergency room.     Lattie HawStephen A Itzelle Gains, MD 05/05/15 [phone]

## 2017-11-25 ENCOUNTER — Telehealth: Payer: PRIVATE HEALTH INSURANCE | Admitting: Physician Assistant

## 2017-11-25 DIAGNOSIS — R6889 Other general symptoms and signs: Secondary | ICD-10-CM

## 2017-11-25 MED ORDER — OSELTAMIVIR PHOSPHATE 75 MG PO CAPS: 75.0000 mg | ORAL_CAPSULE | Freq: Two times a day (BID) | ORAL | 0 refills | Status: DC

## 2017-11-25 NOTE — Progress Notes (Signed)

## 2018-02-03 DIAGNOSIS — K219 Gastro-esophageal reflux disease without esophagitis: Secondary | ICD-10-CM | POA: Diagnosis not present

## 2018-02-03 DIAGNOSIS — J209 Acute bronchitis, unspecified: Secondary | ICD-10-CM | POA: Diagnosis not present

## 2018-02-03 DIAGNOSIS — R634 Abnormal weight loss: Secondary | ICD-10-CM | POA: Diagnosis not present

## 2018-02-03 DIAGNOSIS — J0100 Acute maxillary sinusitis, unspecified: Secondary | ICD-10-CM | POA: Diagnosis not present

## 2018-02-05 DIAGNOSIS — R634 Abnormal weight loss: Secondary | ICD-10-CM | POA: Diagnosis not present

## 2018-02-16 DIAGNOSIS — Z131 Encounter for screening for diabetes mellitus: Secondary | ICD-10-CM | POA: Diagnosis not present

## 2018-02-16 DIAGNOSIS — E161 Other hypoglycemia: Secondary | ICD-10-CM | POA: Diagnosis not present

## 2018-02-16 DIAGNOSIS — R5383 Other fatigue: Secondary | ICD-10-CM | POA: Diagnosis not present

## 2018-02-16 DIAGNOSIS — E291 Testicular hypofunction: Secondary | ICD-10-CM | POA: Diagnosis not present

## 2018-04-07 MED FILL — ACARBOSE 25 MG TABLET: 25 | 30 days supply | Qty: 90 | Fill #0

## 2018-05-07 DIAGNOSIS — E291 Testicular hypofunction: Secondary | ICD-10-CM | POA: Diagnosis not present

## 2018-06-02 DIAGNOSIS — S66821A Laceration of other specified muscles, fascia and tendons at wrist and hand level, right hand, initial encounter: Secondary | ICD-10-CM | POA: Diagnosis not present

## 2018-06-21 DIAGNOSIS — R5383 Other fatigue: Secondary | ICD-10-CM | POA: Diagnosis not present

## 2018-06-21 DIAGNOSIS — E291 Testicular hypofunction: Secondary | ICD-10-CM | POA: Diagnosis not present

## 2018-06-21 DIAGNOSIS — E161 Other hypoglycemia: Secondary | ICD-10-CM | POA: Diagnosis not present

## 2018-06-22 MED FILL — ACARBOSE 25 MG TABLET: 25 | 30 days supply | Qty: 90 | Fill #0

## 2018-07-04 DIAGNOSIS — E291 Testicular hypofunction: Secondary | ICD-10-CM | POA: Diagnosis not present

## 2018-09-05 DIAGNOSIS — H35371 Puckering of macula, right eye: Secondary | ICD-10-CM | POA: Diagnosis not present

## 2018-09-05 DIAGNOSIS — H33311 Horseshoe tear of retina without detachment, right eye: Secondary | ICD-10-CM | POA: Diagnosis not present

## 2018-09-05 DIAGNOSIS — H43813 Vitreous degeneration, bilateral: Secondary | ICD-10-CM | POA: Diagnosis not present

## 2018-09-05 DIAGNOSIS — H33321 Round hole, right eye: Secondary | ICD-10-CM | POA: Diagnosis not present

## 2018-10-25 ENCOUNTER — Telehealth: Payer: PRIVATE HEALTH INSURANCE | Admitting: Physician Assistant

## 2018-10-25 DIAGNOSIS — R69 Illness, unspecified: Secondary | ICD-10-CM

## 2018-10-25 DIAGNOSIS — J111 Influenza due to unidentified influenza virus with other respiratory manifestations: Secondary | ICD-10-CM

## 2018-10-25 MED ORDER — OSELTAMIVIR PHOSPHATE 75 MG PO CAPS: 75.0000 mg | ORAL_CAPSULE | Freq: Two times a day (BID) | ORAL | 0 refills | Status: DC

## 2018-10-25 MED FILL — OSELTAMIVIR PHOSPHATE 75 MG: 75 | 5 days supply | Qty: 10 | Fill #0

## 2018-10-25 NOTE — Progress Notes (Signed)
E visit for Flu like symptoms   We are sorry that you are not feeling well.  Here is how we plan to help! Based on what you have shared with me it looks like you may have a respiratory virus that may be influenza.  Influenza or "the flu" is   an infection caused by a respiratory virus. The flu virus is highly contagious and persons who did not receive their yearly flu vaccination may "catch" the flu from close contact.  We have anti-viral medications to treat the viruses that cause this infection. They are not a "cure" and only shorten the course of the infection. These prescriptions are most effective when they are given within the first 2 days of "flu" symptoms. Antiviral medication are indicated if you have a high risk of complications from the flu. You should also consider an antiviral medication if you are in close contact with someone who is at risk. These medications can help patients avoid complications from the flu  but have side effects that you should know. Possible side effects from Tamiflu or oseltamivir include nausea, vomiting, diarrhea, dizziness, headaches, eye redness, sleep problems or other respiratory symptoms. You should not take Tamiflu if you have an allergy to oseltamivir or any to the ingredients in Tamiflu.  Based upon your symptoms and potential risk factors I have prescribed Oseltamivir (Tamiflu).  It has been sent to your designated pharmacy.  You will take one 75 mg capsule orally twice a day for the next 5 days.  ANYONE WHO HAS FLU SYMPTOMS SHOULD: Stay home. The flu is highly contagious and going out or to work exposes others!  Be sure to drink plenty of fluids. Water is fine as well as fruit juices, sodas and electrolyte beverages. You may want to stay away from caffeine or alcohol. If you are nauseated, try taking small sips of liquids. How do you know if you are getting enough fluid? Your urine should be a pale yellow or almost colorless. Get rest. Taking a steamy  shower or using a humidifier may help nasal congestion and ease sore throat pain. Using a saline nasal spray works much the same way. Cough drops, hard candies and sore throat lozenges may ease your cough. Line up a caregiver. Have someone check on you regularly.   GET HELP RIGHT AWAY IF: You cannot keep down liquids or your medications. You become short of breath Your fell like you are going to pass out or loose consciousness. Your symptoms persist after you have completed your treatment plan MAKE SURE YOU  Understand these instructions. Will watch your condition. Will get help right away if you are not doing well or get worse.  Your e-visit answers were reviewed by a board certified advanced clinical practitioner to complete your personal care plan.  Depending on the condition, your plan could have included both over the counter or prescription medications.  If there is a problem please reply once you have received a response from your provider.  Your safety is important to [place].  If you have drug allergies check your prescription carefully.    You can use MyChart to ask questions about today's visit, request a non-urgent call back, or ask for a work or school excuse for 24 hours related to this e-Visit. If it has been greater than 24 hours you will need to follow up with your provider, or enter a new e-Visit to address those concerns.  You will get an e-mail in the next two  days asking about your experience.  I hope that your e-visit has been valuable and will speed your recovery. Thank you for using e-visits.   ===View-only below this line===   ----- Message -----    From: Jolayne Panther    Sent: 10/25/2018 11:32 AM EST      To: E-Visit Mailing List Subject: E-Visit Submission: Flu Like Symptoms  E-Visit Submission: Flu Like Symptoms --------------------------------  Question: How long have you had flu like symptoms? Answer:   less than 48 hours  Question: Do you have a cough or  sore throat? Answer:   I have both a cough and a sore throat  Question: Are you in close contact with anyone who has similar symptoms ? Answer:   Yes  Question: Do you have a fever? Answer:   Yes, I have a low fever (lower than 101 degrees)  Question: Are you short of breath? Answer:   No  Question: Do you have constant pain in your chest or abdomen? Answer:   No  Question: Are you able to keep liquids down? Answer:   Yes  Question: Have you experienced a seizure or loss of consciousness? Answer:   No  Question: Do you have a headache? Answer:   Yes  Question: Is there a rash? Answer:   No  Question: Are you over the age of 26? Answer:   No  Question: Are you treated for any of the following conditions: Asthma, COPD, diabetes, renal failure on dialysis, AIDS, any neuromuscular disease that effects the clearing of secretions heart failure or heart disease? Answer:   Yes  Question: Describe your sore throat: Answer:   Mouth is very dry, throat is sore and hurts to swallow.  Question: How long have you had a sore throat? Answer:   1 day  Question: Do you have any tenderness or swelling in your neck? Answer:   No  Question: Have you received recent chemotherapy or are you taking a drug that may reduce your ability to fight infections for psoriasis, arthritis, or any other autoimmune condition? Answer:   No  Question: Do you have close contact with anyone who has been diagnosed with any of the conditions listed above? Answer:   No  Question: Are your symptoms severe enough that you think or someone has told you that you need to see a physician urgently? Answer:   No  Question: Are you allergic to Zanamivir or Oseltamivir (Tamiflu)? Answer:   No  Question: Are there children in your family or household that are under the age of 5? Answer:   No  Question: Please list your medication allergies that you may have ? (If 'none' , please list as 'none') Answer:    Prednisone  Question: Please list any additional comments  Answer:   Body aches, fever, and sore throat/dry mouth came on suddenly yesterday. Spent time recently with someone who was Dx with flu.                        Also, covered under my wife's [employer] "choice plan"through her job at [employer].  Should not have a co-pay for this visit.

## 2019-05-09 DIAGNOSIS — M25562 Pain in left knee: Secondary | ICD-10-CM | POA: Diagnosis not present

## 2019-05-09 DIAGNOSIS — E161 Other hypoglycemia: Secondary | ICD-10-CM | POA: Diagnosis not present

## 2019-05-09 DIAGNOSIS — Z23 Encounter for immunization: Secondary | ICD-10-CM | POA: Diagnosis not present

## 2019-05-09 DIAGNOSIS — M79642 Pain in left hand: Secondary | ICD-10-CM | POA: Diagnosis not present

## 2019-05-09 DIAGNOSIS — G8929 Other chronic pain: Secondary | ICD-10-CM | POA: Diagnosis not present

## 2019-05-09 DIAGNOSIS — M545 Low back pain: Secondary | ICD-10-CM | POA: Diagnosis not present

## 2019-05-09 MED FILL — NAPROXEN 500 MG TABLET: 500 | 20 days supply | Qty: 40 | Fill #0

## 2019-05-12 MED FILL — ACARBOSE 25 MG TABLET: 25 | 30 days supply | Qty: 90 | Fill #1

## 2019-05-16 MED FILL — BACLOFEN 10 MG TABS: 10 | 30 days supply | Qty: 60 | Fill #0

## 2019-07-06 DIAGNOSIS — Z0000 Encounter for general adult medical examination without abnormal findings: Secondary | ICD-10-CM | POA: Diagnosis not present

## 2019-07-06 DIAGNOSIS — Z125 Encounter for screening for malignant neoplasm of prostate: Secondary | ICD-10-CM | POA: Diagnosis not present

## 2019-07-10 DIAGNOSIS — K219 Gastro-esophageal reflux disease without esophagitis: Secondary | ICD-10-CM | POA: Diagnosis not present

## 2019-07-10 DIAGNOSIS — D72819 Decreased white blood cell count, unspecified: Secondary | ICD-10-CM | POA: Diagnosis not present

## 2019-07-10 DIAGNOSIS — D696 Thrombocytopenia, unspecified: Secondary | ICD-10-CM | POA: Diagnosis not present

## 2019-07-10 DIAGNOSIS — Z0000 Encounter for general adult medical examination without abnormal findings: Secondary | ICD-10-CM | POA: Diagnosis not present

## 2019-07-10 DIAGNOSIS — E785 Hyperlipidemia, unspecified: Secondary | ICD-10-CM | POA: Diagnosis not present

## 2019-10-27 ENCOUNTER — Ambulatory Visit (INDEPENDENT_AMBULATORY_CARE_PROVIDER_SITE_OTHER): Payer: No Typology Code available for payment source | Admitting: Physician Assistant

## 2019-10-27 ENCOUNTER — Encounter: Payer: Self-pay | Admitting: Physician Assistant

## 2019-10-27 VITALS — BP 151/81 | HR 60 | Ht 72.0 in | Wt 209.0 lb

## 2019-10-27 DIAGNOSIS — E785 Hyperlipidemia, unspecified: Secondary | ICD-10-CM

## 2019-10-27 DIAGNOSIS — E161 Other hypoglycemia: Secondary | ICD-10-CM

## 2019-10-27 DIAGNOSIS — Z13220 Encounter for screening for lipoid disorders: Secondary | ICD-10-CM | POA: Diagnosis not present

## 2019-10-27 DIAGNOSIS — R252 Cramp and spasm: Secondary | ICD-10-CM | POA: Diagnosis not present

## 2019-10-27 DIAGNOSIS — M79644 Pain in right finger(s): Secondary | ICD-10-CM

## 2019-10-27 DIAGNOSIS — M79645 Pain in left finger(s): Secondary | ICD-10-CM

## 2019-10-27 DIAGNOSIS — Z131 Encounter for screening for diabetes mellitus: Secondary | ICD-10-CM

## 2019-10-27 LAB — POCT GLYCOSYLATED HEMOGLOBIN (HGB A1C): Hemoglobin A1C: 5.1 % (ref 4.0–5.6)

## 2019-10-27 MED ORDER — MELOXICAM 15 MG PO TABS: 15.0000 mg | ORAL_TABLET | Freq: Every day | ORAL | 1 refills | Status: DC

## 2019-10-27 MED FILL — MELOXICAM 15 MG TABLET: 15 | 30 days supply | Qty: 30 | Fill #0

## 2019-10-27 NOTE — Patient Instructions (Addendum)
Tumeric for inflammation 573m twice a day.  Glucosamine chondrotin Magnesium 5032m 2 weeks of mobic daily in the morning with food.   Dr. T Darene Lamernjection in office.   Hypoglycemia Hypoglycemia occurs when the level of sugar (glucose) in the blood is too low. Hypoglycemia can happen in people who do or do not have diabetes. It can develop quickly, and it can be a medical emergency. For most people with diabetes, a blood glucose level below 70 mg/dL (3.9 mmol/L) is considered hypoglycemia. Glucose is a type of sugar that provides the body's main source of energy. Certain hormones (insulin and glucagon) control the level of glucose in the blood. Insulin lowers blood glucose, and glucagon raises blood glucose. Hypoglycemia can result from having too much insulin in the bloodstream, or from not eating enough food that contains glucose. You may also have reactive hypoglycemia, which happens within 4 hours after eating a meal. What are the causes? Hypoglycemia occurs most often in people who have diabetes and may be caused by:  Diabetes medicine.  Not eating enough, or not eating often enough.  Increased physical activity.  Drinking alcohol on an empty stomach. If you do not have diabetes, hypoglycemia may be caused by:  A tumor in the pancreas.  Not eating enough, or not eating for long periods at a time (fasting).  A severe infection or illness.  Certain medicines. What increases the risk? Hypoglycemia is more likely to develop in:  People who have diabetes and take medicines to lower blood glucose.  People who abuse alcohol.  People who have a severe illness. What are the signs or symptoms? Mild symptoms Mild hypoglycemia may not cause any symptoms. If you do have symptoms, they may include:  Hunger.  Anxiety.  Sweating and feeling clammy.  Dizziness or feeling light-headed.  Sleepiness.  Nausea.  Increased heart rate.  Headache.  Blurry  vision.  Irritability.  Tingling or numbness around the mouth, lips, or tongue.  A change in coordination.  Restless sleep. Moderate symptoms Moderate hypoglycemia can cause:  Mental confusion and poor judgment.  Behavior changes.  Weakness.  Irregular heartbeat. Severe symptoms Severe hypoglycemia is a medical emergency. It can cause:  Fainting.  Seizures.  Loss of consciousness (coma).  Death. How is this diagnosed? Hypoglycemia is diagnosed with a blood test to measure your blood glucose level. This blood test is done while you are having symptoms. Your health care provider may also do a physical exam and review your medical history. How is this treated? This condition can often be treated by immediately eating or drinking something that contains sugar, such as:  Fruit juice, 4-6 oz (120-150 mL).  Regular soda (not diet soda), 4-6 oz (120-150 mL).  Low-fat milk, 4 oz (120 mL).  Several pieces of hard candy.  Sugar or honey, 1 Tbsp (15 mL). Treating hypoglycemia if you have diabetes If you are alert and able to swallow safely, follow the 15:15 rule:  Take 15 grams of a rapid-acting carbohydrate. Talk with your health care provider about how much you should take.  Rapid-acting options include: ? Glucose pills (take 15 grams). ? 6-8 pieces of hard candy. ? 4-6 oz (120-150 mL) of fruit juice. ? 4-6 oz (120-150 mL) of regular (not diet) soda. ? 1 Tbsp (15 mL) honey or sugar.  Check your blood glucose 15 minutes after you take the carbohydrate.  If the repeat blood glucose level is still at or below 70 mg/dL (3.9 mmol/L), take 15 grams  of a carbohydrate again.  If your blood glucose level does not increase above 70 mg/dL (3.9 mmol/L) after 3 tries, seek emergency medical care.  After your blood glucose level returns to normal, eat a meal or a snack within 1 hour.  Treating severe hypoglycemia Severe hypoglycemia is when your blood glucose level is at or  below 54 mg/dL (3 mmol/L). Severe hypoglycemia is a medical emergency. Get medical help right away. If you have severe hypoglycemia and you cannot eat or drink, you may need an injection of glucagon. A family member or close friend should learn how to check your blood glucose and how to give you a glucagon injection. Ask your health care provider if you need to have an emergency glucagon injection kit available. Severe hypoglycemia may need to be treated in a hospital. The treatment may include getting glucose through an IV. You may also need treatment for the cause of your hypoglycemia. Follow these instructions at home:  General instructions  Take over-the-counter and prescription medicines only as told by your health care provider.  Monitor your blood glucose as told by your health care provider.  Limit alcohol intake to no more than 1 drink a day for nonpregnant women and 2 drinks a day for men. One drink equals 12 oz of beer (355 mL), 5 oz of wine (148 mL), or 1 oz of hard liquor (44 mL).  Keep all follow-up visits as told by your health care provider. This is important. If you have diabetes:  Always have a rapid-acting carbohydrate snack with you to treat low blood glucose.  Follow your diabetes management plan as directed. Make sure you: ? Know the symptoms of hypoglycemia. It is important to treat it right away to prevent it from becoming severe. ? Take your medicines as directed. ? Follow your exercise plan. ? Follow your meal plan. Eat on time, and do not skip meals. ? Check your blood glucose as often as directed. Always check before and after exercise. ? Follow your sick day plan whenever you cannot eat or drink normally. Make this plan in advance with your health care provider.  Share your diabetes management plan with people in your workplace, school, and household.  Check your urine for ketones when you are ill and as told by your health care provider.  Carry a medical  alert card or wear medical alert jewelry. Contact a health care provider if:  You have problems keeping your blood glucose in your target range.  You have frequent episodes of hypoglycemia. Get help right away if:  You continue to have hypoglycemia symptoms after eating or drinking something containing glucose.  Your blood glucose is at or below 54 mg/dL (3 mmol/L).  You have a seizure.  You faint. These symptoms may represent a serious problem that is an emergency. Do not wait to see if the symptoms will go away. Get medical help right away. Call your local emergency services (911 in the [place] Summary  Hypoglycemia occurs when the level of sugar (glucose) in the blood is too low.  Hypoglycemia can happen in people who do or do not have diabetes. It can develop quickly, and it can be a medical emergency.  Make sure you know the symptoms of hypoglycemia and how to treat it.  Always have a rapid-acting carbohydrate snack with you to treat low blood sugar. This information is not intended to replace advice given to you by your health care provider. Make sure you discuss any questions   you have with your health care provider. Document Revised: 03/28/2018 Document Reviewed: 11/08/2015 Elsevier Patient Education  2020 Elsevier Inc.  Leg Cramps Leg cramps occur when one or more muscles tighten and you have no control over this tightening (involuntary muscle contraction). Muscle cramps can develop in any muscle, but the most common place is in the calf muscles of the leg. Those cramps can occur during exercise or when you are at rest. Leg cramps are painful, and they may last for a few seconds to a few minutes. Cramps may return several times before they finally stop. Usually, leg cramps are not caused by a serious medical problem. In many cases, the cause is not known. Some common causes include:  Excessive physical effort (overexertion), such as during intense exercise.  Overuse from  repetitive motions, or doing the same thing over and over.  Staying in a certain position for a long period of time.  Improper preparation, form, or technique while performing a sport or an activity.  Dehydration.  Injury.  Side effects of certain medicines.  Abnormally low levels of minerals in your blood (electrolytes), especially potassium and calcium. This could result from: ? Pregnancy. ? Taking diuretic medicines. Follow these instructions at home: Eating and drinking  Drink enough fluid to keep your urine pale yellow. Staying hydrated may help prevent cramps.  Eat a healthy diet that includes plenty of nutrients to help your muscles function. A healthy diet includes fruits and vegetables, lean protein, whole grains, and low-fat or nonfat dairy products. Managing pain, stiffness, and swelling      Try massaging, stretching, and relaxing the affected muscle. Do this for several minutes at a time.  If directed, put ice on areas that are sore or painful after a cramp: ? Put ice in a plastic bag. ? Place a towel between your skin and the bag. ? Leave the ice on for 20 minutes, 2-3 times a day.  If directed, apply heat to muscles that are tense or tight. Do this before you exercise, or as often as told by your health care provider. Use the heat source that your health care provider recommends, such as a moist heat pack or a heating pad. ? Place a towel between your skin and the heat source. ? Leave the heat on for 20-30 minutes. ? Remove the heat if your skin turns bright red. This is especially important if you are unable to feel pain, heat, or cold. You may have a greater risk of getting burned.  Try taking hot showers or baths to help relax tight muscles. General instructions  If you are having frequent leg cramps, avoid intense exercise for several days.  Take over-the-counter and prescription medicines only as told by your health care provider.  Keep all follow-up  visits as told by your health care provider. This is important. Contact a health care provider if:  Your leg cramps get more severe or more frequent, or they do not improve over time.  Your foot becomes cold, numb, or blue. Summary  Muscle cramps can develop in any muscle, but the most common place is in the calf muscles of the leg.  Leg cramps are painful, and they may last for a few seconds to a few minutes.  Usually, leg cramps are not caused by a serious medical problem. Often, the cause is not known.  Stay hydrated and take over-the-counter and prescription medicines only as told by your health care provider. This information is not intended to   replace advice given to you by your health care provider. Make sure you discuss any questions you have with your health care provider. Document Revised: 09/17/2017 Document Reviewed: 07/15/2017 Elsevier Patient Education  2020 Elsevier Inc.  

## 2019-10-27 NOTE — Progress Notes (Signed)
New Patient Office Visit  Subjective:  Patient ID: Juan Holt, male    DOB: 10-Jun-1961  Age: 59 y.o. MRN: [id number]  CC:  Chief Complaint  Patient presents with  . Establish Care    HPI Juan Holt presents to establish care.   Pt does have hx of hypoglycemia and Hyperlipidemia.   He is having a lot of muscle cramps in bilateral legs. He wonders if anything else is going on. He admits to not drinking enough. Cramps mostly at night.   He is having a lot of thumb pain bilaterally. Seems to be getting worse. 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    Past Medical History:  Diagnosis Date  . Hypoglycemia     Past Surgical History:  Procedure Laterality Date  . VASECTOMY      Family History  Problem Relation Age of Onset  . Cancer Father        small cell    Social History   Socioeconomic History  . Marital status: Married    Spouse name: Not on file  . Number of children: Not on file  . Years of education: Not on file  . Highest education level: Not on file  Occupational History  . Not on file  Tobacco Use  . Smoking status: Never Smoker  . Smokeless tobacco: Never Used  Substance and Sexual Activity  . Alcohol use: Yes    Comment: 2 beers per week  . Drug use: Never  . Sexual activity: Yes    Partners: Female    Comment: vasectomy  Other Topics Concern  . Not on file  Social History Narrative  . Not on file   Social Determinants of Health   Financial Resource Strain:   . Difficulty of Paying Living Expenses: Not on file  Food Insecurity:   . Worried About Programme researcher, broadcasting/film/video in the Last Year: Not on file  . Ran Out of Food in the Last Year: Not on file  Transportation Needs:   . Lack of Transportation (Medical): Not on file  . Lack of Transportation (Non-Medical): Not on file  Physical Activity:   . Days of Exercise per Week: Not on file  . Minutes of Exercise per Session: Not on file  Stress:   . Feeling of Stress : Not on file  Social  Connections:   . Frequency of Communication with Friends and Family: Not on file  . Frequency of Social Gatherings with Friends and Family: Not on file  . Attends Religious Services: Not on file  . Active Member of Clubs or Organizations: Not on file  . Attends Banker Meetings: Not on file  . Marital Status: Not on file  Intimate Partner Violence:   . Fear of Current or Ex-Partner: Not on file  . Emotionally Abused: Not on file  . Physically Abused: Not on file  . Sexually Abused: Not on file    ROS Review of Systems See HPI>  Objective:   Today's Vitals: BP (!) 151/81   Pulse 60   Ht 6' (1.829 m)   Wt 209 lb (94.8 kg)   SpO2 99%   BMI 28.35 kg/m   Physical Exam Vitals reviewed.  Constitutional:      Appearance: Normal appearance.  HENT:     Head: Normocephalic.  Neck:     Vascular: No carotid bruit.  Cardiovascular:     Rate and Rhythm: Normal rate and regular rhythm.  Pulses: Normal pulses.  Pulmonary:     Effort: Pulmonary effort is normal.     Breath sounds: Normal breath sounds.  Musculoskeletal:     Comments: Tenderness over bilateral CMC joint.  No swelling.  Hand grip 5/5.   Neurological:     General: No focal deficit present.     Mental Status: He is alert and oriented to person, place, and time.  Psychiatric:        Mood and Affect: Mood normal.     Assessment & Plan:  Marland KitchenMarland KitchenKip was seen today for establish care.  Diagnoses and all orders for this visit:  Reactive hypoglycemia -     POCT glycosylated hemoglobin (Hb A1C)  Screening for lipid disorders  Screening for diabetes mellitus -     COMPLETE METABOLIC PANEL WITH GFR -     POCT glycosylated hemoglobin (Hb A1C)  Muscle cramps -     COMPLETE METABOLIC PANEL WITH GFR -     TSH -     Magnesium  Hyperlipidemia LDL goal <100  Bilateral thumb pain -     meloxicam (MOBIC) 15 MG tablet; Take 1 tablet (15 mg total) by mouth daily.   .. Lab Results  Component Value Date    HGBA1C 5.1 10/27/2019   Reassured A1C stable.  Continue to eat small frequent meals/snacks to keep BS stable. HO given.   Muscle cramps could be coming from potassium/insulin relationship. The more he can keep sugars controlled the better his cramps with be.  Will check labs.  Start magnesium 250mg  daily.  STAY hydrated.  Consider muscle massage/warm compresses before bed.   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  Start mobic to use as needed.  Consider tumeric.  Follow up with Dr. Darene Lamer for joint injection consideration.   Discussed hx of hyperlipidemia and CV risk of 9.4 percent. Pt declined statin today. Will recheck in 4 months. He is resistant to any medication for anything. Discussed low cholesterol diet.     Follow-up: Return in about 2 weeks (around 11/10/2019) for Dr. Darene Lamer. 4 week me. Iran Planas, PA-C

## 2019-10-28 LAB — MAGNESIUM: Magnesium: 2.2 mg/dL (ref 1.5–2.5)

## 2019-10-28 LAB — COMPLETE METABOLIC PANEL WITH GFR
AG Ratio: 2 (calc) (ref 1.0–2.5)
ALT: 23 U/L (ref 9–46)
AST: 19 U/L (ref 10–35)
Albumin: 4.3 g/dL (ref 3.6–5.1)
Alkaline phosphatase (APISO): 70 U/L (ref 35–144)
BUN: 22 mg/dL (ref 7–25)
CO2: 26 mmol/L (ref 20–32)
Calcium: 9.2 mg/dL (ref 8.6–10.3)
Chloride: 106 mmol/L (ref 98–110)
Creat: 0.92 mg/dL (ref 0.70–1.33)
GFR, Est African American: 105 mL/min/{1.73_m2} (ref >=60)
GFR, Est Non African American: 91 mL/min/{1.73_m2} (ref >=60)
Globulin: 2.2 g/dL (calc) (ref 1.9–3.7)
Glucose, Bld: 108 mg/dL — ABNORMAL HIGH (ref 65–99)
Potassium: 4.1 mmol/L (ref 3.5–5.3)
Sodium: 140 mmol/L (ref 135–146)
Total Bilirubin: 0.6 mg/dL (ref 0.2–1.2)
Total Protein: 6.5 g/dL (ref 6.1–8.1)

## 2019-10-28 LAB — TSH: TSH: 1.28 mIU/L (ref 0.40–4.50)

## 2019-10-30 DIAGNOSIS — R252 Cramp and spasm: Secondary | ICD-10-CM | POA: Insufficient documentation

## 2019-10-30 DIAGNOSIS — M79644 Pain in right finger(s): Secondary | ICD-10-CM | POA: Insufficient documentation

## 2019-10-30 DIAGNOSIS — E161 Other hypoglycemia: Secondary | ICD-10-CM | POA: Insufficient documentation

## 2019-10-30 DIAGNOSIS — E785 Hyperlipidemia, unspecified: Secondary | ICD-10-CM | POA: Insufficient documentation

## 2019-10-30 NOTE — Progress Notes (Signed)
Labs look really good. Thyroid normal. Magnesium normal. Kidney and liver look good.   Make sure to schedule appt with Dr. Karie Schwalbe for thumb arthritis.

## 2019-11-16 ENCOUNTER — Encounter: Payer: Self-pay | Admitting: Sports Medicine

## 2019-11-16 ENCOUNTER — Ambulatory Visit (INDEPENDENT_AMBULATORY_CARE_PROVIDER_SITE_OTHER): Payer: No Typology Code available for payment source

## 2019-11-16 ENCOUNTER — Other Ambulatory Visit: Payer: Self-pay

## 2019-11-16 ENCOUNTER — Ambulatory Visit (INDEPENDENT_AMBULATORY_CARE_PROVIDER_SITE_OTHER): Payer: No Typology Code available for payment source | Admitting: Sports Medicine

## 2019-11-16 DIAGNOSIS — S6991XA Unspecified injury of right wrist, hand and finger(s), initial encounter: Secondary | ICD-10-CM | POA: Diagnosis not present

## 2019-11-16 MED ORDER — HYDROCODONE-ACETAMINOPHEN 5-325 MG PO TABS: 1.0000 | ORAL_TABLET | Freq: Three times a day (TID) | ORAL | 0 refills | Status: DC | PRN

## 2019-11-16 MED ORDER — MELOXICAM 15 MG PO TABS: ORAL_TABLET | ORAL | 3 refills | Status: DC

## 2019-11-16 MED FILL — HYDROCODON-APAP 5-325: 5-325 | 5 days supply | Qty: 15 | Fill #0

## 2019-11-16 NOTE — Progress Notes (Signed)
    Procedures performed today:    None.  Independent interpretation of tests performed by another provider:   None.  Impression and Recommendations:    Right wrist injury Nayel was swinging a hammer, he felt an immediate pain just past the tip of the ulna and was unable to use his wrist. On exam he has pain referrable more to the TFCC rather than the extensor carpi ulnaris. Velcro wrist brace, meloxicam, short course of hydrocodone as he does have the pain to car today. X-rays. Return to see me in 2 to 3 weeks, ideally on Monday and if no better we will proceed with MR arthrogram of the wrist.     ___________________________________________ [name]. Benjamin Stain, M.D., ABFM., CAQSM. Primary Care and Sports Medicine [facility]  Adjunct Instructor of Family Medicine  [employer]

## 2019-11-16 NOTE — Assessment & Plan Note (Signed)
Juan Holt was swinging a hammer, he felt an immediate pain just past the tip of the ulna and was unable to use his wrist. On exam he has pain referrable more to the TFCC rather than the extensor carpi ulnaris. Velcro wrist brace, meloxicam, short course of hydrocodone as he does have the pain to car today. X-rays. Return to see me in 2 to 3 weeks, ideally on Monday and if no better we will proceed with MR arthrogram of the wrist.

## 2019-11-27 ENCOUNTER — Ambulatory Visit (INDEPENDENT_AMBULATORY_CARE_PROVIDER_SITE_OTHER): Payer: No Typology Code available for payment source | Admitting: Sports Medicine

## 2019-11-27 ENCOUNTER — Other Ambulatory Visit: Payer: Self-pay

## 2019-11-27 DIAGNOSIS — M79645 Pain in left finger(s): Secondary | ICD-10-CM | POA: Diagnosis not present

## 2019-11-27 DIAGNOSIS — M79644 Pain in right finger(s): Secondary | ICD-10-CM

## 2019-11-27 DIAGNOSIS — S6991XA Unspecified injury of right wrist, hand and finger(s), initial encounter: Secondary | ICD-10-CM | POA: Diagnosis not present

## 2019-11-27 NOTE — Progress Notes (Signed)
    Procedures performed today:    Procedure: Real-time Ultrasound Guided injection of the right TFCC Device: Samsung HS60  Verbal informed consent obtained.  Time-out conducted.  Noted no overlying erythema, induration, or other signs of local infection.  Skin prepped in a sterile fashion.  Local anesthesia: Topical Ethyl chloride.  With sterile technique and under real time ultrasound guidance:  Noted heterogenous and irregular appearing triangular fibrocartilaginous complex at the tip of the ulna, I advanced a 25-gauge needle into this region injected 1 cc Kenalog 40, 1 cc lidocaine.   Completed without difficulty  Pain immediately resolved suggesting accurate placement of the medication.  Advised to call if fevers/chills, erythema, induration, drainage, or persistent bleeding.  Images permanently stored and available for review in the ultrasound unit.  Impression: Technically successful ultrasound guided injection.  Independent interpretation of tests performed by another provider:   None.  Impression and Recommendations:    Right wrist injury Juan Holt returns, he has improved slightly with 2 weeks of wrist brace splinting. He continues to have pain referable to the triangular fibrocartilaginous complex rather than the extensor carpi ulnaris. Today we injected his TFCC region. He reported complete relief of pain. I would like him to continue wear the brace and return to see me in 2 weeks. If no better afterwards we will consider MRI.  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    ___________________________________________ [name]. Benjamin Stain, M.D., ABFM., CAQSM. Primary Care and Sports Medicine [place] [facility]  Adjunct Instructor of Family Medicine  [employer]

## 2019-11-27 NOTE — Assessment & Plan Note (Signed)
Cope returns, he has improved slightly with 2 weeks of wrist brace splinting. He continues to have pain referable to the triangular fibrocartilaginous complex rather than the extensor carpi ulnaris. Today we injected his TFCC region. He reported complete relief of pain. I would like him to continue wear the brace and return to see me in 2 weeks. If no better afterwards we will consider MRI.

## 2019-11-27 NOTE — Assessment & Plan Note (Signed)
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

## 2019-12-11 ENCOUNTER — Encounter: Payer: Self-pay | Admitting: Sports Medicine

## 2019-12-11 ENCOUNTER — Ambulatory Visit (INDEPENDENT_AMBULATORY_CARE_PROVIDER_SITE_OTHER): Payer: No Typology Code available for payment source | Admitting: Sports Medicine

## 2019-12-11 DIAGNOSIS — S6991XD Unspecified injury of right wrist, hand and finger(s), subsequent encounter: Secondary | ICD-10-CM | POA: Diagnosis not present

## 2019-12-11 DIAGNOSIS — M79644 Pain in right finger(s): Secondary | ICD-10-CM

## 2019-12-11 DIAGNOSIS — M79645 Pain in left finger(s): Secondary | ICD-10-CM | POA: Diagnosis not present

## 2019-12-11 NOTE — Assessment & Plan Note (Signed)
At the last visit I injected hips TFCC. He returns today approximately 70% better, continues his brace, happy with how things went. Return as needed for this.

## 2019-12-11 NOTE — Assessment & Plan Note (Signed)
Juan Holt returns, he has what sounds like thumb basal joint arthritis. X-rays showed mild OA, the right side got better with the TFCC injection, today we injected his left thumb basal joint, return in 1 month.

## 2019-12-11 NOTE — Progress Notes (Signed)
    Procedures performed today:    Procedure: Real-time Ultrasound Guided injection of the left first [facility] Device: Samsung HS60  Verbal informed consent obtained.  Time-out conducted.  Noted no overlying erythema, induration, or other signs of local infection.  Skin prepped in a sterile fashion.  Local anesthesia: Topical Ethyl chloride.  With sterile technique and under real time ultrasound guidance:  1/2 cc Kenalog 40, 1/2 cc lidocaine injected easily completed without difficulty  Pain immediately resolved suggesting accurate placement of the medication.  Advised to call if fevers/chills, erythema, induration, drainage, or persistent bleeding.  Images permanently stored and available for review in the ultrasound unit.  Impression: Technically successful ultrasound guided injection.  Independent interpretation of tests performed by another provider:   None.  Impression and Recommendations:    Bilateral thumb pain Juan Holt returns, he has what sounds like thumb basal joint arthritis. X-rays showed mild OA, the right side got better with the TFCC injection, today we injected his left thumb basal joint, return in 1 month.  Right wrist injury At the last visit I injected hips TFCC. He returns today approximately 70% better, continues his brace, happy with how things went. Return as needed for this.    ___________________________________________ [name]. Benjamin Stain, M.D., ABFM., CAQSM. Primary Care and Sports Medicine [facility]  Adjunct Instructor of Family Medicine  [employer]

## 2019-12-19 ENCOUNTER — Ambulatory Visit (INDEPENDENT_AMBULATORY_CARE_PROVIDER_SITE_OTHER): Payer: No Typology Code available for payment source | Admitting: Sports Medicine

## 2019-12-19 ENCOUNTER — Other Ambulatory Visit: Payer: Self-pay

## 2019-12-19 ENCOUNTER — Encounter: Payer: Self-pay | Admitting: Physician Assistant

## 2019-12-19 ENCOUNTER — Ambulatory Visit (INDEPENDENT_AMBULATORY_CARE_PROVIDER_SITE_OTHER): Payer: No Typology Code available for payment source

## 2019-12-19 VITALS — BP 134/81 | HR 60 | Ht 72.0 in | Wt 209.0 lb

## 2019-12-19 DIAGNOSIS — M79642 Pain in left hand: Secondary | ICD-10-CM

## 2019-12-19 DIAGNOSIS — S6992XA Unspecified injury of left wrist, hand and finger(s), initial encounter: Secondary | ICD-10-CM

## 2019-12-19 DIAGNOSIS — M795 Residual foreign body in soft tissue: Secondary | ICD-10-CM

## 2019-12-19 DIAGNOSIS — L03114 Cellulitis of left upper limb: Secondary | ICD-10-CM

## 2019-12-19 MED ORDER — DEXAMETHASONE 4 MG PO TABS: 4.0000 mg | ORAL_TABLET | Freq: Three times a day (TID) | ORAL | 0 refills | Status: DC

## 2019-12-19 MED ORDER — DOXYCYCLINE HYCLATE 100 MG PO TABS: 100.0000 mg | ORAL_TABLET | Freq: Two times a day (BID) | ORAL | 0 refills | Status: DC

## 2019-12-19 MED ORDER — HYDROCODONE-ACETAMINOPHEN 5-325 MG PO TABS: 1.0000 | ORAL_TABLET | Freq: Four times a day (QID) | ORAL | 0 refills | Status: AC | PRN

## 2019-12-19 MED FILL — DOXYCYCLINE HYCLATE 100 MG: 100 | 10 days supply | Qty: 20 | Fill #0

## 2019-12-19 MED FILL — HYDROCODON-APAP 5-325: 5-325 | 5 days supply | Qty: 20 | Fill #0

## 2019-12-19 MED FILL — DEXAMETHASONE 4 MG TABLET: 4 | 5 days supply | Qty: 15 | Fill #0

## 2019-12-19 NOTE — Assessment & Plan Note (Addendum)
Yesterday Juan Holt was using a hammer, a piece flew off and hit him in the left first webspace. X-rays do show a small piece of metal that appears to be fairly well embedded at least 1.8 cm below the skin. I am unable to see it on ultrasound today. He is inflamed, red, swollen and painful, adding Decadron to take down the swelling, doxycycline, he is up-to-date on tetanus. He is having some paresthesias along the proximal radial second digit. I am not sure if there is a nerve injury but certainly the swelling is contributing to some of his numbness. I am applying a radial gutter splint today. If this numbness resolves at the follow-up visit once his swelling is resolved we will likely not intervene. Return to see me in 1 to 2 weeks, if symptoms have improved considerably we will likely just leave it alone.

## 2019-12-19 NOTE — Progress Notes (Signed)
    Procedures performed today:    None.  Independent interpretation of notes and tests performed by another provider:   None.  Impression and Recommendations:    Hand injury, left, initial encounter Yesterday Juan Holt was using a hammer, a piece flew off and hit him in the left first webspace. X-rays do show a small piece of metal that appears to be fairly well embedded at least 1.8 cm below the skin. I am unable to see it on ultrasound today. He is inflamed, red, swollen and painful, adding Decadron to take down the swelling, doxycycline, he is up-to-date on tetanus. He is having some paresthesias along the proximal radial second digit. I am not sure if there is a nerve injury but certainly the swelling is contributing to some of his numbness. I am applying a radial gutter splint today. If this numbness resolves at the follow-up visit once his swelling is resolved we will likely not intervene. Return to see me in 1 to 2 weeks, if symptoms have improved considerably we will likely just leave it alone.    ___________________________________________ [name]. Benjamin Stain, M.D., ABFM., CAQSM. Primary Care and Sports Medicine  [facility]  Adjunct Instructor of Family Medicine  [employer]

## 2019-12-19 NOTE — Progress Notes (Signed)
   Subjective:    Patient ID: Mccrae Speciale, male    DOB: 18-Dec-1960, 59 y.o.   MRN: [id number]  HPI  Pt is a 59 yo male who presents to the clinic with left hand pain between thumb and index finger after injury. He was using a hammer to hit another hammer when a piece of metal got knocked off the hammer head and when into his left hand. This happened 4 oclock yesterday. He has a lot of swelling and redness. No fever, chills, body aches. He does have some tingling in index finger and hard to flex index finger.   .. Active Ambulatory Problems    Diagnosis Date Noted  . Muscle cramps 10/30/2019  . Reactive hypoglycemia 10/30/2019  . Hyperlipidemia LDL goal <100 10/30/2019  . Bilateral thumb pain 10/30/2019  . Right wrist injury 11/16/2019  . Hand injury, left, initial encounter 12/19/2019   Resolved Ambulatory Problems    Diagnosis Date Noted  . No Resolved Ambulatory Problems   Past Medical History:  Diagnosis Date  . Hypoglycemia        Review of Systems See HPI.     Objective:   Physical Exam Vitals reviewed.  Constitutional:      Appearance: Normal appearance.  Musculoskeletal:     Comments: Left hand between thumb and 2nd metacarpal swelling, redness, warmth. Tender to palpation. Central puncture area without drainage.  Hand grip normal.  Swelling and pain restricts ROM of second metacarpal.   Neurological:     Mental Status: He is alert.           Assessment & Plan:  Marland KitchenMarland KitchenKip was seen today for hand injury.  Diagnoses and all orders for this visit:  Hand injury, left, initial encounter -     DG Hand Complete Left; Future -     HYDROcodone-acetaminophen (NORCO/VICODIN) 5-325 MG tablet; Take 1 tablet by mouth every 6 (six) hours as needed for up to 5 days for moderate pain.  Left hand pain -     DG Hand Complete Left; Future  Foreign body (FB) in soft tissue -     doxycycline (VIBRA-TABS) 100 MG tablet; Take 1 tablet (100 mg total) by mouth 2 (two) times  daily.  Cellulitis of left hand -     doxycycline (VIBRA-TABS) 100 MG tablet; Take 1 tablet (100 mg total) by mouth 2 (two) times daily.  Xray confirmed foreign body in the soft tissue along the radial aspect of 2nd metacarpal shaft.   Consulted sports medicine Dr. Benjamin Stain.  See his note.   Pt is up to date on Tdap.  Signs of infection. Treated with doxycycline, cool compresses, norco for pain.   Continue management with Dr. Karie Schwalbe.

## 2019-12-27 ENCOUNTER — Ambulatory Visit (INDEPENDENT_AMBULATORY_CARE_PROVIDER_SITE_OTHER): Payer: No Typology Code available for payment source | Admitting: Sports Medicine

## 2019-12-27 ENCOUNTER — Other Ambulatory Visit: Payer: Self-pay

## 2019-12-27 DIAGNOSIS — S6992XA Unspecified injury of left wrist, hand and finger(s), initial encounter: Secondary | ICD-10-CM | POA: Diagnosis not present

## 2019-12-27 NOTE — Progress Notes (Signed)
    Procedures performed today:    None.  Independent interpretation of notes and tests performed by another provider:   None.  Impression and Recommendations:    Hand injury, left, initial encounter Jaicion returns, last week he was using a hammer and a piece flew off and hit him in his left first webspace, x-rays did show a piece of metal embedded fairly deep, 1.8 cm below the skin. He did have some paresthesias along the radial aspect of his second digit. We treated him conservatively at first, Decadron, doxycycline and swelling has resolved.  Unfortunately he continues to have paresthesias and numbness on the radial aspect of his index finger, so I would like him to see Dr. Amanda Pea for consideration of surgical removal, and possible repair of the digital nerve. He is up-to-date on tetanus vaccination.    ___________________________________________ [name]. Benjamin Stain, M.D., ABFM., CAQSM. Primary Care and Sports Medicine [facility]  Adjunct Instructor of Family Medicine  [employer]

## 2019-12-27 NOTE — Assessment & Plan Note (Signed)
Juan Holt returns, last week he was using a hammer and a piece flew off and hit him in his left first webspace, x-rays did show a piece of metal embedded fairly deep, 1.8 cm below the skin. He did have some paresthesias along the radial aspect of his second digit. We treated him conservatively at first, Decadron, doxycycline and swelling has resolved.  Unfortunately he continues to have paresthesias and numbness on the radial aspect of his index finger, so I would like him to see Dr. Amanda Pea for consideration of surgical removal, and possible repair of the digital nerve. He is up-to-date on tetanus vaccination.

## 2020-01-08 ENCOUNTER — Ambulatory Visit (INDEPENDENT_AMBULATORY_CARE_PROVIDER_SITE_OTHER): Payer: No Typology Code available for payment source | Admitting: Sports Medicine

## 2020-01-08 ENCOUNTER — Other Ambulatory Visit: Payer: Self-pay | Admitting: *Deleted

## 2020-01-08 ENCOUNTER — Other Ambulatory Visit: Payer: Self-pay

## 2020-01-08 ENCOUNTER — Encounter: Payer: Self-pay | Admitting: Sports Medicine

## 2020-01-08 DIAGNOSIS — S6992XA Unspecified injury of left wrist, hand and finger(s), initial encounter: Secondary | ICD-10-CM

## 2020-01-08 DIAGNOSIS — L84 Corns and callosities: Secondary | ICD-10-CM | POA: Insufficient documentation

## 2020-01-08 DIAGNOSIS — R252 Cramp and spasm: Secondary | ICD-10-CM | POA: Diagnosis not present

## 2020-01-08 MED ORDER — GABAPENTIN 300 MG PO CAPS: 300.0000 mg | ORAL_CAPSULE | Freq: Three times a day (TID) | ORAL | 3 refills | Status: DC

## 2020-01-08 MED FILL — GABAPENTIN 300 MG CAPSULE: 300 | 30 days supply | Qty: 90 | Fill #0

## 2020-01-08 NOTE — Assessment & Plan Note (Signed)
Juan Holt returns, he was using a hammer, he had a piece of metal fly off and hit him in the first webspace, x-rays showed a embedded piece of metal. As the swelling has resolved I am now able to feel this piece of metal and what feels to be the subcutaneous tissues. He does have some paresthesias on the radial aspect of his index finger concerning for digital nerve injury. We referred him to Dr. Amanda Pea about 2 weeks ago and he has not yet heard back from the office. Since we have not been able to get through and neither has he I am happy to bring him back tomorrow sometime later this week for excision of the foreign body, if nerve repair is needed this would certainly need to go back to Dr. Amanda Pea.

## 2020-01-08 NOTE — Assessment & Plan Note (Signed)
I cored out his corn today, i.e. trimming of hyperkeratotic plaque. Return as needed for this.

## 2020-01-08 NOTE — Assessment & Plan Note (Signed)
Poseidon continues to have full body muscle cramps, though some of this may be radicular I would like him to seek further treatment with his PCP. I am going to add gabapentin, certainly if electrolyte abnormalities are not at play we could consider nerve conduction, EMG. If his PCP does not feel as though this is medical, and more neurologic/orthopedic then I am happy to take over.

## 2020-01-08 NOTE — Progress Notes (Addendum)
    Procedures performed today:    I cleaned and then cored out his corn.  Independent interpretation of notes and tests performed by another provider:   None.  Impression and Recommendations:    Hand injury, left, initial encounter Jex returns, he was using a hammer, he had a piece of metal fly off and hit him in the first webspace, x-rays showed a embedded piece of metal. As the swelling has resolved I am now able to feel this piece of metal and what feels to be the subcutaneous tissues. He does have some paresthesias on the radial aspect of his index finger concerning for digital nerve injury. We referred him to Dr. Amanda Pea about 2 weeks ago and he has not yet heard back from the office. Since we have not been able to get through and neither has he I am happy to bring him back tomorrow sometime later this week for excision of the foreign body, if nerve repair is needed this would certainly need to go back to Dr. Amanda Pea.  Corn of toe I cored out his corn today, i.e. trimming of hyperkeratotic plaque. Return as needed for this.  Muscle cramps Keziah continues to have full body muscle cramps, though some of this may be radicular I would like him to seek further treatment with his PCP. I am going to add gabapentin, certainly if electrolyte abnormalities are not at play we could consider nerve conduction, EMG. If his PCP does not feel as though this is medical, and more neurologic/orthopedic then I am happy to take over.    ___________________________________________ Juan Holt. Benjamin Stain, M.D., ABFM., CAQSM. Primary Care and Sports Medicine [place] [facility]  Adjunct Instructor of Family Medicine  [employer]

## 2020-01-08 NOTE — Patient Instructions (Signed)
Return to see me in a 30-minute slot for excision of foreign body if unable to get appointment with Dr. Amanda Pea

## 2020-01-26 MED FILL — CEPHALEXIN 500 MG CAPSULE: 500 | 8 days supply | Qty: 32 | Fill #0

## 2020-03-04 MED FILL — oxyCODONE HCL 5 MG TABS: 5 | 7 days supply | Qty: 42 | Fill #0

## 2020-04-23 ENCOUNTER — Ambulatory Visit (INDEPENDENT_AMBULATORY_CARE_PROVIDER_SITE_OTHER): Payer: No Typology Code available for payment source | Admitting: Physician Assistant

## 2020-04-23 ENCOUNTER — Other Ambulatory Visit: Payer: Self-pay

## 2020-04-23 ENCOUNTER — Ambulatory Visit (INDEPENDENT_AMBULATORY_CARE_PROVIDER_SITE_OTHER): Payer: No Typology Code available for payment source

## 2020-04-23 VITALS — BP 136/82 | HR 65 | Ht 72.0 in | Wt 207.0 lb

## 2020-04-23 DIAGNOSIS — M25562 Pain in left knee: Secondary | ICD-10-CM

## 2020-04-23 DIAGNOSIS — M25552 Pain in left hip: Secondary | ICD-10-CM | POA: Diagnosis not present

## 2020-04-23 DIAGNOSIS — Z1159 Encounter for screening for other viral diseases: Secondary | ICD-10-CM

## 2020-04-23 DIAGNOSIS — R5382 Chronic fatigue, unspecified: Secondary | ICD-10-CM

## 2020-04-23 DIAGNOSIS — G4733 Obstructive sleep apnea (adult) (pediatric): Secondary | ICD-10-CM | POA: Diagnosis not present

## 2020-04-23 DIAGNOSIS — Z0000 Encounter for general adult medical examination without abnormal findings: Secondary | ICD-10-CM | POA: Diagnosis not present

## 2020-04-23 DIAGNOSIS — Z13220 Encounter for screening for lipoid disorders: Secondary | ICD-10-CM

## 2020-04-23 DIAGNOSIS — R350 Frequency of micturition: Secondary | ICD-10-CM

## 2020-04-23 DIAGNOSIS — Z131 Encounter for screening for diabetes mellitus: Secondary | ICD-10-CM

## 2020-04-23 DIAGNOSIS — E161 Other hypoglycemia: Secondary | ICD-10-CM

## 2020-04-23 DIAGNOSIS — N401 Enlarged prostate with lower urinary tract symptoms: Secondary | ICD-10-CM

## 2020-04-23 MED ORDER — MELOXICAM 15 MG PO TABS
15.0000 mg | ORAL_TABLET | Freq: Every day | ORAL | 5 refills | Status: DC
Start: 2020-04-23 — End: 2020-09-20

## 2020-04-23 MED FILL — MELOXICAM 15 MG TABLET: 15 | 30 days supply | Qty: 30 | Fill #0
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
## 2020-04-23 NOTE — Patient Instructions (Signed)

## 2020-04-24 ENCOUNTER — Telehealth: Payer: Self-pay | Admitting: Physician Assistant

## 2020-04-24 ENCOUNTER — Encounter: Payer: Self-pay | Admitting: Physician Assistant

## 2020-04-24 DIAGNOSIS — M25552 Pain in left hip: Secondary | ICD-10-CM | POA: Insufficient documentation

## 2020-04-24 DIAGNOSIS — R5382 Chronic fatigue, unspecified: Secondary | ICD-10-CM | POA: Insufficient documentation

## 2020-04-24 DIAGNOSIS — E781 Pure hyperglyceridemia: Secondary | ICD-10-CM | POA: Insufficient documentation

## 2020-04-24 DIAGNOSIS — S83207A Unspecified tear of unspecified meniscus, current injury, left knee, initial encounter: Secondary | ICD-10-CM | POA: Insufficient documentation

## 2020-04-24 DIAGNOSIS — E291 Testicular hypofunction: Secondary | ICD-10-CM | POA: Insufficient documentation

## 2020-04-24 DIAGNOSIS — E785 Hyperlipidemia, unspecified: Secondary | ICD-10-CM

## 2020-04-24 DIAGNOSIS — E559 Vitamin D deficiency, unspecified: Secondary | ICD-10-CM | POA: Insufficient documentation

## 2020-04-24 DIAGNOSIS — N401 Enlarged prostate with lower urinary tract symptoms: Secondary | ICD-10-CM | POA: Insufficient documentation

## 2020-04-24 LAB — CBC
HCT: 47.3 % (ref 38.5–50.0)
Hemoglobin: 16.4 g/dL (ref 13.2–17.1)
MCH: 32.5 pg (ref 27.0–33.0)
MCHC: 34.7 g/dL (ref 32.0–36.0)
MCV: 93.8 fL (ref 80.0–100.0)
MPV: 11.4 fL (ref 7.5–12.5)
Platelets: 156 10*3/uL (ref 140–400)
RBC: 5.04 10*6/uL (ref 4.20–5.80)
RDW: 11.7 % (ref 11.0–15.0)
WBC: 6 10*3/uL (ref 3.8–10.8)

## 2020-04-24 LAB — COMPLETE METABOLIC PANEL WITH GFR
AG Ratio: 2 (calc) (ref 1.0–2.5)
ALT: 22 U/L (ref 9–46)
AST: 13 U/L (ref 10–35)
Albumin: 4.2 g/dL (ref 3.6–5.1)
Alkaline phosphatase (APISO): 76 U/L (ref 35–144)
BUN: 20 mg/dL (ref 7–25)
CO2: 28 mmol/L (ref 20–32)
Calcium: 9.4 mg/dL (ref 8.6–10.3)
Chloride: 110 mmol/L (ref 98–110)
Creat: 0.86 mg/dL (ref 0.70–1.25)
GFR, Est African American: 109 mL/min/{1.73_m2} (ref >=60)
GFR, Est Non African American: 94 mL/min/{1.73_m2} (ref >=60)
Globulin: 2.1 g/dL (calc) (ref 1.9–3.7)
Glucose, Bld: 98 mg/dL (ref 65–139)
Potassium: 3.9 mmol/L (ref 3.5–5.3)
Sodium: 141 mmol/L (ref 135–146)
Total Bilirubin: 0.6 mg/dL (ref 0.2–1.2)
Total Protein: 6.3 g/dL (ref 6.1–8.1)

## 2020-04-24 LAB — LIPID PANEL W/REFLEX DIRECT LDL
Cholesterol: 196 mg/dL (ref <200)
HDL: 41 mg/dL (ref >=40)
LDL Cholesterol (Calc): 121 mg/dL (calc) — ABNORMAL HIGH
Non-HDL Cholesterol (Calc): 155 mg/dL (calc) — ABNORMAL HIGH (ref <130)
Total CHOL/HDL Ratio: 4.8 (calc) (ref <5.0)
Triglycerides: 222 mg/dL — ABNORMAL HIGH (ref <150)

## 2020-04-24 LAB — B12 AND FOLATE PANEL
Folate: 16.8 ng/mL
Vitamin B-12: 685 pg/mL (ref 200–1100)

## 2020-04-24 LAB — TESTOSTERONE: Testosterone: 115 ng/dL — ABNORMAL LOW (ref 250–827)

## 2020-04-24 LAB — HEPATITIS C ANTIBODY
Hepatitis C Ab: NONREACTIVE
SIGNAL TO CUT-OFF: 0.05 (ref <1.00)

## 2020-04-24 LAB — HEMOGLOBIN A1C
Hgb A1c MFr Bld: 5.1 % of total Hgb (ref <5.7)
Mean Plasma Glucose: 100 (calc)
eAG (mmol/L): 5.5 (calc)

## 2020-04-24 LAB — PSA: PSA: 0.3 ng/mL (ref <=4.0)

## 2020-04-24 LAB — VITAMIN D 25 HYDROXY (VIT D DEFICIENCY, FRACTURES): Vit D, 25-Hydroxy: 24 ng/mL — ABNORMAL LOW (ref 30–100)

## 2020-04-24 LAB — TSH: TSH: 1.16 mIU/L (ref 0.40–4.50)

## 2020-04-24 NOTE — Telephone Encounter (Signed)
Added to result notes to call patient.

## 2020-04-24 NOTE — Telephone Encounter (Signed)
Sent via [employer] but here is info for you.   I originally said no need for cholesterol medication but I did put you in the cholesterol calculator risk and risk was 10.3 percent(even though LDL not terrible) due to age risk is elevated. Suggest daily ASA 81mg  and low dose statin. Would you be ok me sending this?

## 2020-04-24 NOTE — Progress Notes (Signed)
Juan Holt,   Kidney, liver, glucose look great.  A1C normal. No diabetes.  Thyroid looks great.  Vitamin D is low. Start D3 1000 units daily.  PSA looks great. Would you like to start a medication to help with prostate symptoms?  No anemia and WBC looks great.  Testosterone is low. We need to repeat in 2 weeks and if continues to be low can consider replacement. This alone could make you feel a lot better. Replacement is topical gels or shots. Do you have a preference?  Cholesterol is fair with no other risk factors. TG are elevated. Watch sugar and carbs and processed foods. Start fish oil 4000mg  daily.   Suprisingly xray of hip did not show arthritis. I think you need MRI. Are you ok with ordering?

## 2020-04-24 NOTE — Progress Notes (Signed)
Minimal arthritis. Need MRI of knee as well.

## 2020-04-25 ENCOUNTER — Other Ambulatory Visit: Payer: Self-pay | Admitting: Neurology

## 2020-04-25 DIAGNOSIS — R7989 Other specified abnormal findings of blood chemistry: Secondary | ICD-10-CM

## 2020-04-29 ENCOUNTER — Telehealth: Payer: Self-pay | Admitting: Neurology

## 2020-04-29 DIAGNOSIS — R7989 Other specified abnormal findings of blood chemistry: Secondary | ICD-10-CM

## 2020-04-29 NOTE — Telephone Encounter (Signed)
Patient left vm stating he wants to discuss lab results. Looks like Lesly Rubenstein has not reviewed yet. Will call patient once I get results from [facility].

## 2020-04-30 NOTE — Telephone Encounter (Signed)
Patient called again. Have you reviewed lab results?

## 2020-05-01 NOTE — Telephone Encounter (Signed)
I sent my whole review under imaging tab.   Kidney, liver, glucose look great.  A1C normal. No diabetes.  Thyroid looks great.  Vitamin D is low. Start D3 1000 units daily.  PSA looks great. Would you like to start a medication to help with prostate symptoms?  No anemia and WBC looks great.  Testosterone is low. We need to repeat in 2 weeks and if continues to be low can consider replacement. This alone could make you feel a lot better. Replacement is topical gels or shots. Do you have a preference?  Cholesterol is fair with no other risk factors. TG are elevated. Watch sugar and carbs and processed foods. Start fish oil 4000mg  daily.    Suprisingly xray of hip did not show arthritis. I think you need MRI. Are you ok with ordering?

## 2020-05-01 NOTE — Telephone Encounter (Signed)
Ok thank you 

## 2020-05-01 NOTE — Telephone Encounter (Signed)
Spoke with patient. Since this was under imaging, I did not see it. I already discussed results with patient.   What patient wanted to discuss was to have labs drawn elsewhere. He needed testosterone rechecked in a couple weeks. He has a bruise/knot on his arm from having blood drawn downstairs. I offered appt to have it looked at and he declined. He said it is getting better. He will send pictures via mychart.   He lives in [place] and wants order faxed to him at [phone] so he can take to a [facility] closer to him. Lab order faxed. Confirmation received.   Daviel Allegretto - fyi. You may be receiving photos.

## 2020-05-01 NOTE — Addendum Note (Signed)
Addended bySilvio Pate on: 05/01/2020 08:31 AM   Modules accepted: Orders

## 2020-09-20 ENCOUNTER — Ambulatory Visit (INDEPENDENT_AMBULATORY_CARE_PROVIDER_SITE_OTHER): Payer: No Typology Code available for payment source

## 2020-09-20 ENCOUNTER — Encounter: Payer: Self-pay | Admitting: Physician Assistant

## 2020-09-20 ENCOUNTER — Other Ambulatory Visit: Payer: Self-pay

## 2020-09-20 ENCOUNTER — Ambulatory Visit (INDEPENDENT_AMBULATORY_CARE_PROVIDER_SITE_OTHER): Payer: No Typology Code available for payment source | Admitting: Sports Medicine

## 2020-09-20 DIAGNOSIS — M1712 Unilateral primary osteoarthritis, left knee: Secondary | ICD-10-CM | POA: Diagnosis not present

## 2020-09-20 DIAGNOSIS — M25562 Pain in left knee: Secondary | ICD-10-CM

## 2020-09-20 MED ORDER — MELOXICAM 15 MG PO TABS: ORAL_TABLET | ORAL | 3 refills | Status: DC
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
## 2020-09-20 NOTE — Telephone Encounter (Signed)
Referral for focus placed. Please call with appt for patient with Dr. Karie Schwalbe.
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
## 2020-09-23 ENCOUNTER — Ambulatory Visit: Payer: No Typology Code available for payment source | Admitting: Sports Medicine

## 2020-10-04 ENCOUNTER — Ambulatory Visit (INDEPENDENT_AMBULATORY_CARE_PROVIDER_SITE_OTHER): Payer: No Typology Code available for payment source | Admitting: Sports Medicine

## 2020-10-04 ENCOUNTER — Other Ambulatory Visit: Payer: Self-pay

## 2020-10-04 ENCOUNTER — Ambulatory Visit (INDEPENDENT_AMBULATORY_CARE_PROVIDER_SITE_OTHER): Payer: No Typology Code available for payment source

## 2020-10-04 DIAGNOSIS — S50852A Superficial foreign body of left forearm, initial encounter: Secondary | ICD-10-CM | POA: Diagnosis not present

## 2020-10-04 DIAGNOSIS — S83207D Unspecified tear of unspecified meniscus, current injury, left knee, subsequent encounter: Secondary | ICD-10-CM
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
## 2020-10-13 ENCOUNTER — Ambulatory Visit (INDEPENDENT_AMBULATORY_CARE_PROVIDER_SITE_OTHER): Payer: No Typology Code available for payment source

## 2020-10-13 ENCOUNTER — Other Ambulatory Visit: Payer: Self-pay

## 2020-10-13 DIAGNOSIS — S83242A Other tear of medial meniscus, current injury, left knee, initial encounter: Secondary | ICD-10-CM

## 2020-11-01 ENCOUNTER — Ambulatory Visit: Payer: No Typology Code available for payment source | Admitting: Sports Medicine

## 2020-11-06 ENCOUNTER — Ambulatory Visit: Payer: Self-pay | Admitting: Sports Medicine

## 2020-11-06 DIAGNOSIS — M5417 Radiculopathy, lumbosacral region: Secondary | ICD-10-CM | POA: Diagnosis not present

## 2020-11-06 DIAGNOSIS — M9903 Segmental and somatic dysfunction of lumbar region: Secondary | ICD-10-CM | POA: Diagnosis not present

## 2020-11-06 DIAGNOSIS — M9905 Segmental and somatic dysfunction of pelvic region: Secondary | ICD-10-CM | POA: Diagnosis not present

## 2020-11-06 DIAGNOSIS — M5137 Other intervertebral disc degeneration, lumbosacral region: Secondary | ICD-10-CM | POA: Diagnosis not present

## 2020-11-06 DIAGNOSIS — M9904 Segmental and somatic dysfunction of sacral region: Secondary | ICD-10-CM | POA: Diagnosis not present

## 2020-11-07 ENCOUNTER — Ambulatory Visit: Payer: Self-pay | Admitting: Sports Medicine

## 2020-11-08 ENCOUNTER — Ambulatory Visit (INDEPENDENT_AMBULATORY_CARE_PROVIDER_SITE_OTHER): Payer: 59 | Admitting: Sports Medicine

## 2020-11-08 ENCOUNTER — Other Ambulatory Visit: Payer: Self-pay

## 2020-11-08 DIAGNOSIS — S83207D Unspecified tear of unspecified meniscus, current injury, left knee, subsequent encounter: Secondary | ICD-10-CM | POA: Diagnosis not present

## 2020-11-08 DIAGNOSIS — M25552 Pain in left hip: Secondary | ICD-10-CM
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
## 2020-11-08 NOTE — Assessment & Plan Note (Addendum)
Juan Holt has also been having increasing pain in his left hip/groin, exam is for the most part unremarkable. At this point he is working with his chiropractor, if chiropractic manipulation fails he can come to me and we can consider an injection, I would like a baseline x-ray today.

## 2020-11-13 DIAGNOSIS — M9903 Segmental and somatic dysfunction of lumbar region: Secondary | ICD-10-CM | POA: Diagnosis not present

## 2020-11-13 DIAGNOSIS — M5417 Radiculopathy, lumbosacral region: Secondary | ICD-10-CM | POA: Diagnosis not present

## 2020-11-13 DIAGNOSIS — M5137 Other intervertebral disc degeneration, lumbosacral region: Secondary | ICD-10-CM | POA: Diagnosis not present

## 2020-11-13 DIAGNOSIS — M9904 Segmental and somatic dysfunction of sacral region: Secondary | ICD-10-CM | POA: Diagnosis not present

## 2020-11-13 DIAGNOSIS — M9905 Segmental and somatic dysfunction of pelvic region: Secondary | ICD-10-CM | POA: Diagnosis not present

## 2021-04-29 ENCOUNTER — Encounter: Payer: Self-pay | Admitting: Physician Assistant

## 2021-04-29 ENCOUNTER — Ambulatory Visit (INDEPENDENT_AMBULATORY_CARE_PROVIDER_SITE_OTHER): Payer: 59 | Admitting: Physician Assistant

## 2021-04-29 ENCOUNTER — Other Ambulatory Visit: Payer: Self-pay

## 2021-04-29 VITALS — BP 160/85 | HR 62 | Ht 72.0 in | Wt 211.0 lb

## 2021-04-29 DIAGNOSIS — Z114 Encounter for screening for human immunodeficiency virus [HIV]: Secondary | ICD-10-CM | POA: Diagnosis not present

## 2021-04-29 DIAGNOSIS — E559 Vitamin D deficiency, unspecified: Secondary | ICD-10-CM | POA: Diagnosis not present

## 2021-04-29 DIAGNOSIS — Z0000 Encounter for general adult medical examination without abnormal findings: Secondary | ICD-10-CM | POA: Diagnosis not present

## 2021-04-29 DIAGNOSIS — R7989 Other specified abnormal findings of blood chemistry: Secondary | ICD-10-CM

## 2021-04-29 DIAGNOSIS — K2100 Gastro-esophageal reflux disease with esophagitis, without bleeding: Secondary | ICD-10-CM

## 2021-04-29 DIAGNOSIS — N529 Male erectile dysfunction, unspecified: Secondary | ICD-10-CM

## 2021-04-29 DIAGNOSIS — Z125 Encounter for screening for malignant neoplasm of prostate: Secondary | ICD-10-CM

## 2021-04-29 DIAGNOSIS — E785 Hyperlipidemia, unspecified: Secondary | ICD-10-CM

## 2021-04-29 DIAGNOSIS — Z131 Encounter for screening for diabetes mellitus: Secondary | ICD-10-CM

## 2021-04-29 DIAGNOSIS — I10 Essential (primary) hypertension: Secondary | ICD-10-CM

## 2021-04-29 NOTE — Progress Notes (Signed)
Subjective:    Patient ID: Juan Holt, male    DOB: Jan 30, 1961, 60 y.o.   MRN: [id number]  HPI Pt is a 60 yo male with OSA, GERD, HLD, vitamin D insufficiency, hx of low testosterone, GERD who presents to the clinic for CPE.   He admits he is very tired most of the time. Not using CPAP. Admits not getting good rest. Admits urinary symptoms are getting a little more noticeable. He has sexual desire but maintaining erection is harder. He wonders about medication.   His reflux has been worse recently. Not taking medication. No black tarry stools. No energy.   .. Active Ambulatory Problems    Diagnosis Date Noted   Muscle cramps 10/30/2019   Reactive hypoglycemia 10/30/2019   Hyperlipidemia LDL goal <100 10/30/2019   Bilateral thumb pain 10/30/2019   Right wrist injury 11/16/2019   Hand injury, left, initial encounter 12/19/2019   Corn of toe 01/08/2020   OSA (obstructive sleep apnea) 04/23/2020   Hypertriglyceridemia 04/24/2020   Vitamin D insufficiency 04/24/2020   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   Benign prostatic hyperplasia with urinary frequency 04/24/2020   Chronic fatigue 04/24/2020   Left hip pain 04/24/2020   Acute meniscal tear of left knee 04/24/2020   Low testosterone in male 04/29/2021   Gastroesophageal reflux disease with esophagitis without hemorrhage 04/29/2021   Resolved Ambulatory Problems    Diagnosis Date Noted   No Resolved Ambulatory Problems   Past Medical History:  Diagnosis Date   Hypoglycemia    .Marland Kitchen Family History  Problem Relation Age of Onset   Cancer Father        small cell   .Marland Kitchen Social History   Socioeconomic History   Marital status: Married    Spouse name: Not on file   Number of children: Not on file   Years of education: Not on file   Highest education level: Not on file  Occupational History   Not on file  Tobacco Use   Smoking status: Never   Smokeless tobacco: Never  Substance and Sexual Activity   Alcohol use: Yes     Comment: 2 beers per week   Drug use: Never   Sexual activity: Yes    Partners: Female    Comment: vasectomy  Other Topics Concern   Not on file  Social History Narrative   Not on file   Social Determinants of Health   Financial Resource Strain: Not on file  Food Insecurity: Not on file  Transportation Needs: Not on file  Physical Activity: Not on file  Stress: Not on file  Social Connections: Not on file  Intimate Partner Violence: Not on file     Review of Systems See HPI.     Objective:   Physical Exam  BP (!) 160/85   Pulse 62   Ht 6' (1.829 m)   Wt 211 lb (95.7 kg)   SpO2 98%   BMI 28.62 kg/m   General Appearance:    Alert, cooperative, no distress, appears stated age, obese  Head:    Normocephalic, without obvious abnormality, atraumatic  Eyes:    PERRL, conjunctiva/corneas clear, EOM's intact, fundi    benign, both eyes       Ears:    Normal TM's and external ear canals, both ears  Nose:   Nares normal, septum midline, mucosa normal, no drainage    or sinus tenderness  Throat:   Lips, mucosa, and tongue normal; teeth and gums normal  Neck:  Supple, symmetrical, trachea midline, no adenopathy;       thyroid:  No enlargement/tenderness/nodules; no carotid   bruit or JVD  Back:     Symmetric, no curvature, ROM normal, no CVA tenderness  Lungs:     Clear to auscultation bilaterally, respirations unlabored  Chest wall:    No tenderness or deformity  Heart:    Regular rate and rhythm, S1 and S2 normal, no murmur, rub   or gallop  Abdomen:     Soft, non-tender, bowel sounds active all four quadrants,    no masses, no organomegaly        Extremities:   Extremities normal, atraumatic, no cyanosis or edema  Pulses:   2+ and symmetric all extremities  Skin:   Skin color, texture, turgor normal, no rashes or lesions  Lymph nodes:   Cervical, supraclavicular, and axillary nodes normal  Neurologic:   CNII-XII intact. Normal strength, sensation and reflexes       throughout    .Marland Kitchen Depression screen [facility] 2/9 04/23/2020 10/27/2019  Decreased Interest 0 0  Down, Depressed, Hopeless 0 0  PHQ - 2 Score 0 0  Altered sleeping 0 0  Tired, decreased energy 3 0  Change in appetite 0 0  Feeling bad or failure about yourself  0 0  Trouble concentrating 0 0  Moving slowly or fidgety/restless 0 0  Suicidal thoughts 0 0  PHQ-9 Score 3 0  Difficult doing work/chores Somewhat difficult Not difficult at all   .Marland Kitchen GAD 7 : Generalized Anxiety Score 04/23/2020 10/27/2019  Nervous, Anxious, on Edge 0 1  Control/stop worrying 1 1  Worry too much - different things 1 1  Trouble relaxing 2 3  Restless 2 3  Easily annoyed or irritable 3 2  Afraid - awful might happen 0 0  Total GAD 7 Score 9 11  Anxiety Difficulty Somewhat difficult Somewhat difficult   .Marland Kitchen  RO-AUA SYMPTOM     Row Name 04/29/21 0900         During the last Month   Sensation of Bladder not Empty Not at all     Urinate<2 hours after last About half the time     Mult. stop/start when voiding Not at all     Difficult to postpone voiding Less than 1 time in 5     Weak urinary stream Less than half the time     Push/strain to begin urination Not at all     Times per night up to urinate Less than 1 time in 5           OTHER     Total Score 7            ..  SHIM     Row Name 04/29/21 0938         SHIM: Over the last 6 months:   How do you rate your confidence that you could get and keep an erection? Low     When you had erections with sexual stimulation, how often were your erections hard enough for penetration (entering your partner)? Most Times (much more than half the time)     During sexual intercourse, how often were you able to maintain your erection after you had penetrated (entered) your partner? Sometimes (about half the time)     During sexual intercourse, how difficult was it to maintain your erection to completion of intercourse? Difficult     When you attempted sexual  intercourse, how often was  it satisfactory for you? Sometimes (about half the time)           SHIM Total Score     SHIM 15            .Marland Kitchen  Androgen Deficiency in the Aging Male     Row Name 04/29/21 0900         Androgen Deficiency in the Aging Male   Do you have a decrease in libido (sex drive) Yes     Do you have lack of energy Yes     Do you have a decrease in strength and/or endurance Yes     Have you lost height Yes     Have you noticed a decreased "enjoyment of life" Yes     Are you sad and/or grumpy No     Are your erections less strong Yes     Have you noticed a recent deterioration in your ability to play sports Yes     Are you falling asleep after dinner Yes     Has there been a recent deterioration in your work performance Yes                  Assessment & Plan:  Marland KitchenMarland KitchenKip was seen today for annual exam.  Diagnoses and all orders for this visit:  Routine physical examination -     CBC with Differential/Platelet -     COMPLETE METABOLIC PANEL WITH GFR -     Lipid Panel w/reflex Direct LDL -     PSA -     Testosterone Total,Free,Bio, Males  Low testosterone in male -     Testosterone Total,Free,Bio, Males  Hyperlipidemia LDL goal <100 -     Lipid Panel w/reflex Direct LDL  Screening for diabetes mellitus -     COMPLETE METABOLIC PANEL WITH GFR -     Hemoglobin A1c  Screening PSA (prostate specific antigen) -     PSA  Vitamin D deficiency -     VITAMIN D 25 Hydroxy (Vit-D Deficiency, Fractures)  Screening for HIV without presence of risk factors -     HIV antibody (with reflex)  Gastroesophageal reflux disease with esophagitis without hemorrhage -     H. pylori breath test  Erectile dysfunction, unspecified erectile dysfunction type  Primary hypertension  .Marland KitchenStart a regular exercise program and make sure you are eating a healthy diet Try to eat 4 servings of dairy a day or take a calcium supplement (500mg  twice a day). PHQ no concerns.   AUA midly elevated. Ordered PsA.  ADAM and SHIM elevated discussed ED. Gave HO. Must control BP before medications for ED.  Check testosterone.  Pt declines shingles/covid vaccines.  Tdap UTD.  Colonoscopy UTD.    BP not to goal. Discussed DASH diet, avoidance of alcohol. Strongly encouraged using CPAP. Must get to goal before ED treated. Recheck in 4 weeks.    Pt very hesitant to start any medications. Get labs and will discuss personal risk in 4 weeks.

## 2021-04-29 NOTE — Patient Instructions (Signed)
Health Maintenance, Male Adopting a healthy lifestyle and getting preventive care are important in promoting health and wellness. Ask your health care provider about: The right schedule for you to have regular tests and exams. Things you can do on your own to prevent diseases and keep yourself healthy. What should I know about diet, weight, and exercise? Eat a healthy diet  Eat a diet that includes plenty of vegetables, fruits, low-fat dairy products, and lean protein. Do not eat a lot of foods that are high in solid fats, added sugars, or sodium.  Maintain a healthy weight Body mass index (BMI) is a measurement that can be used to identify possible weight problems. It estimates body fat based on height and weight. Your health care provider can help determine your BMI and help you achieve or maintain ahealthy weight. Get regular exercise Get regular exercise. This is one of the most important things you can do for your health. Most adults should: Exercise for at least 150 minutes each week. The exercise should increase your heart rate and make you sweat (moderate-intensity exercise). Do strengthening exercises at least twice a week. This is in addition to the moderate-intensity exercise. Spend less time sitting. Even light physical activity can be beneficial. Watch cholesterol and blood lipids Have your blood tested for lipids and cholesterol at 61 years of age, then havethis test every 5 years. You may need to have your cholesterol levels checked more often if: Your lipid or cholesterol levels are high. You are older than 61 years of age. You are at high risk for heart disease. What should I know about cancer screening? Many types of cancers can be detected early and may often be prevented. Depending on your health history and family history, you may need to have cancer screening at various ages. This may include screening for: Colorectal cancer. Prostate cancer. Skin cancer. Lung  cancer. What should I know about heart disease, diabetes, and high blood pressure? Blood pressure and heart disease High blood pressure causes heart disease and increases the risk of stroke. This is more likely to develop in people who have high blood pressure readings, are of African descent, or are overweight. Talk with your health care provider about your target blood pressure readings. Have your blood pressure checked: Every 3-5 years if you are 18-39 years of age. Every year if you are 40 years old or older. If you are between the ages of 65 and 75 and are a current or former smoker, ask your health care provider if you should have a one-time screening for abdominal aortic aneurysm (AAA). Diabetes Have regular diabetes screenings. This checks your fasting blood sugar level. Have the screening done: Once every three years after age 45 if you are at a normal weight and have a low risk for diabetes. More often and at a younger age if you are overweight or have a high risk for diabetes. What should I know about preventing infection? Hepatitis B If you have a higher risk for hepatitis B, you should be screened for this virus. Talk with your health care provider to find out if you are at risk forhepatitis B infection. Hepatitis C Blood testing is recommended for: Everyone born from 1945 through 1965. Anyone with known risk factors for hepatitis C. Sexually transmitted infections (STIs) You should be screened each year for STIs, including gonorrhea and chlamydia, if: You are sexually active and are younger than 61 years of age. You are older than 61 years of age   and your health care provider tells you that you are at risk for this type of infection. Your sexual activity has changed since you were last screened, and you are at increased risk for chlamydia or gonorrhea. Ask your health care provider if you are at risk. Ask your health care provider about whether you are at high risk for HIV.  Your health care provider may recommend a prescription medicine to help prevent HIV infection. If you choose to take medicine to prevent HIV, you should first get tested for HIV. You should then be tested every 3 months for as long as you are taking the medicine. Follow these instructions at home: Lifestyle Do not use any products that contain nicotine or tobacco, such as cigarettes, e-cigarettes, and chewing tobacco. If you need help quitting, ask your health care provider. Do not use street drugs. Do not share needles. Ask your health care provider for help if you need support or information about quitting drugs. Alcohol use Do not drink alcohol if your health care provider tells you not to drink. If you drink alcohol: Limit how much you have to 0-2 drinks a day. Be aware of how much alcohol is in your drink. In the [place] one drink equals one 12 oz bottle of beer (355 mL), one 5 oz glass of wine (148 mL), or one 1 oz glass of hard liquor (44 mL). General instructions Schedule regular health, dental, and eye exams. Stay current with your vaccines. Tell your health care provider if: You often feel depressed. You have ever been abused or do not feel safe at home. Summary Adopting a healthy lifestyle and getting preventive care are important in promoting health and wellness. Follow your health care provider's instructions about healthy diet, exercising, and getting tested or screened for diseases. Follow your health care provider's instructions on monitoring your cholesterol and blood pressure. This information is not intended to replace advice given to you by your health care provider. Make sure you discuss any questions you have with your healthcare provider. Document Revised: 09/28/2018 Document Reviewed: 09/28/2018 Elsevier Patient Education  2022 Elsevier Inc.  

## 2021-04-30 LAB — TESTOSTERONE TOTAL,FREE,BIO, MALES
Albumin: 4.4 g/dL (ref 3.6–5.1)
Sex Hormone Binding: 14 nmol/L — ABNORMAL LOW (ref 22–77)
Testosterone: 188 ng/dL — ABNORMAL LOW (ref 250–827)

## 2021-04-30 LAB — CBC WITH DIFFERENTIAL/PLATELET
Absolute Monocytes: 580 cells/uL (ref 200–950)
Basophils Absolute: 29 cells/uL (ref 0–200)
Basophils Relative: 0.5 %
Eosinophils Absolute: 99 cells/uL (ref 15–500)
Eosinophils Relative: 1.7 %
HCT: 49.1 % (ref 38.5–50.0)
Hemoglobin: 16.9 g/dL (ref 13.2–17.1)
Lymphs Abs: 1972 cells/uL (ref 850–3900)
MCH: 32.6 pg (ref 27.0–33.0)
MCHC: 34.4 g/dL (ref 32.0–36.0)
MCV: 94.8 fL (ref 80.0–100.0)
MPV: 11.2 fL (ref 7.5–12.5)
Monocytes Relative: 10 %
Neutro Abs: 3120 cells/uL (ref 1500–7800)
Neutrophils Relative %: 53.8 %
Platelets: 174 10*3/uL (ref 140–400)
RBC: 5.18 10*6/uL (ref 4.20–5.80)
RDW: 12.3 % (ref 11.0–15.0)
Total Lymphocyte: 34 %
WBC: 5.8 10*3/uL (ref 3.8–10.8)

## 2021-04-30 LAB — COMPLETE METABOLIC PANEL WITH GFR
AG Ratio: 1.7 (calc) (ref 1.0–2.5)
ALT: 67 U/L — ABNORMAL HIGH (ref 9–46)
AST: 41 U/L — ABNORMAL HIGH (ref 10–35)
Albumin: 4.4 g/dL (ref 3.6–5.1)
Alkaline phosphatase (APISO): 74 U/L (ref 35–144)
BUN: 18 mg/dL (ref 7–25)
CO2: 28 mmol/L (ref 20–32)
Calcium: 9.5 mg/dL (ref 8.6–10.3)
Chloride: 106 mmol/L (ref 98–110)
Creat: 0.84 mg/dL (ref 0.70–1.35)
Globulin: 2.6 g/dL (calc) (ref 1.9–3.7)
Glucose, Bld: 97 mg/dL (ref 65–99)
Potassium: 4.3 mmol/L (ref 3.5–5.3)
Sodium: 141 mmol/L (ref 135–146)
Total Bilirubin: 0.7 mg/dL (ref 0.2–1.2)
Total Protein: 7 g/dL (ref 6.1–8.1)
eGFR: 99 mL/min/{1.73_m2} (ref >=60)

## 2021-04-30 LAB — PSA: PSA: 0.51 ng/mL (ref <=4.00)

## 2021-04-30 LAB — LIPID PANEL W/REFLEX DIRECT LDL
Cholesterol: 191 mg/dL (ref <200)
HDL: 43 mg/dL (ref >=40)
LDL Cholesterol (Calc): 105 mg/dL (calc) — ABNORMAL HIGH
Non-HDL Cholesterol (Calc): 148 mg/dL (calc) — ABNORMAL HIGH (ref <130)
Total CHOL/HDL Ratio: 4.4 (calc) (ref <5.0)
Triglycerides: 322 mg/dL — ABNORMAL HIGH (ref <150)

## 2021-04-30 LAB — HEMOGLOBIN A1C
Hgb A1c MFr Bld: 5.1 % of total Hgb (ref <5.7)
Mean Plasma Glucose: 100 mg/dL
eAG (mmol/L): 5.5 mmol/L

## 2021-04-30 LAB — VITAMIN D 25 HYDROXY (VIT D DEFICIENCY, FRACTURES): Vit D, 25-Hydroxy: 34 ng/mL (ref 30–100)

## 2021-04-30 LAB — H. PYLORI BREATH TEST: H. pylori Breath Test: NOT DETECTED

## 2021-04-30 LAB — HIV ANTIBODY (ROUTINE TESTING W REFLEX): HIV 1&2 Ab, 4th Generation: NONREACTIVE

## 2021-05-02 NOTE — Progress Notes (Signed)
Sincere,   H.pylori negative. If reflux symptoms persist consider a daily acid reducer. I can send to pharmacy if needed.   Vitamin D normal range but low normal. I would take vitamin D at least 1000 units daily.  WBC/RBC/hemoglobin look good.  Glucose,kidney both look great.  A1C is 5.1 and looks wonderful.  Prostate enzyme looks great! Testosterone LOW. Recheck testosterone in 2 weeks to confirm low level for insurance and then start testosterone shots since you did not want to do gels. This could help you a lot with many of symptoms you complained of.  HDL, good cholesterol, stable. LDL, bad cholesterol, stable.  TG elevated.  10 year risk is 11.6 percent. You should consider statin and fish oil.

## 2021-08-05 IMAGING — DX DG HIP (WITH OR WITHOUT PELVIS) 2-3V*L*
3 series · 3 of 3 positions shown · non-contrast
Comparison: None.

CLINICAL DATA: Hip and groin pain

EXAM:
DG HIP (WITH OR WITHOUT PELVIS) 2-3V LEFT

[pelvis ap (1 of 2)]
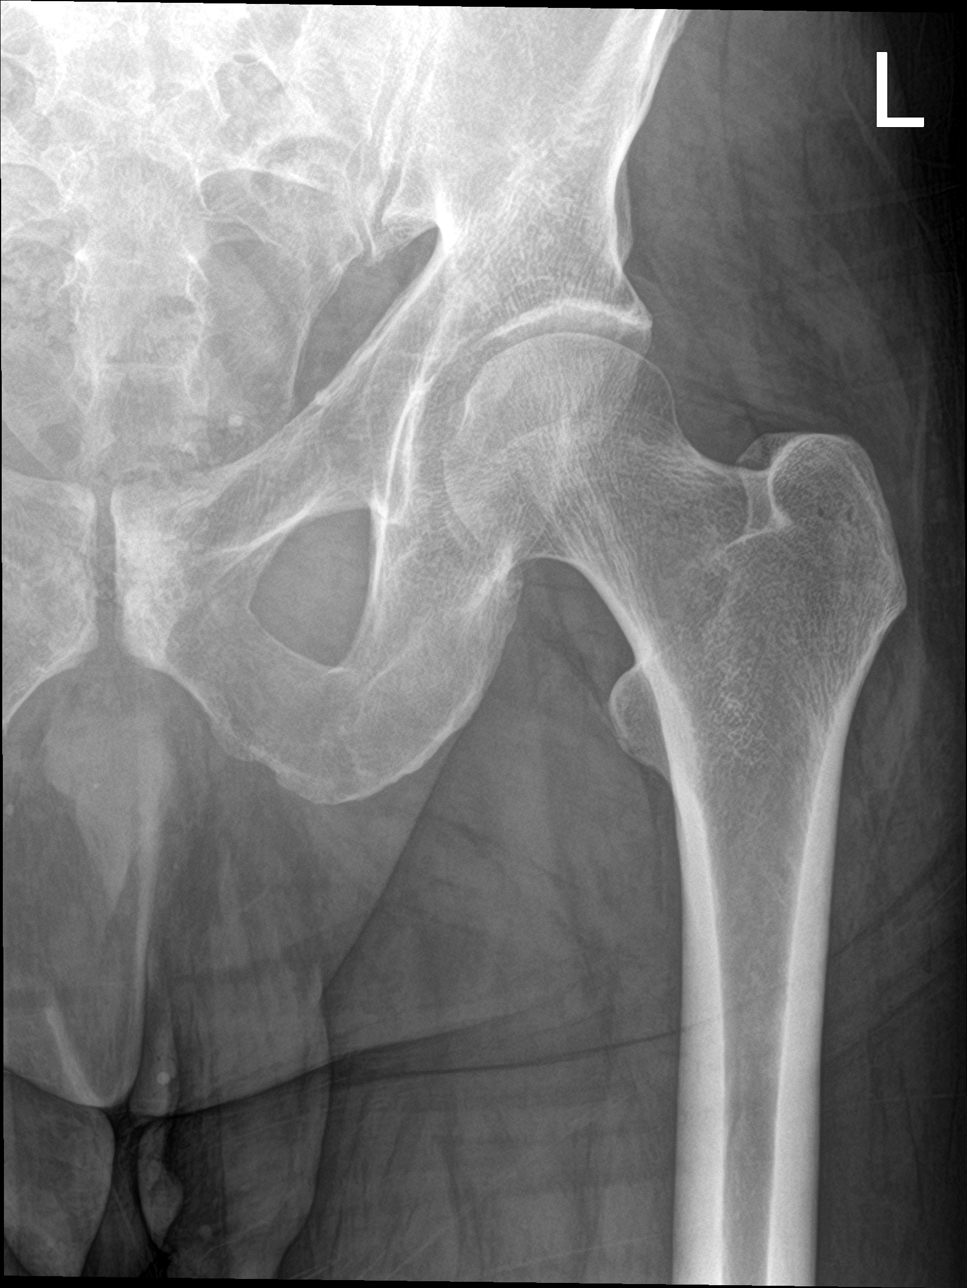

[hip lat]
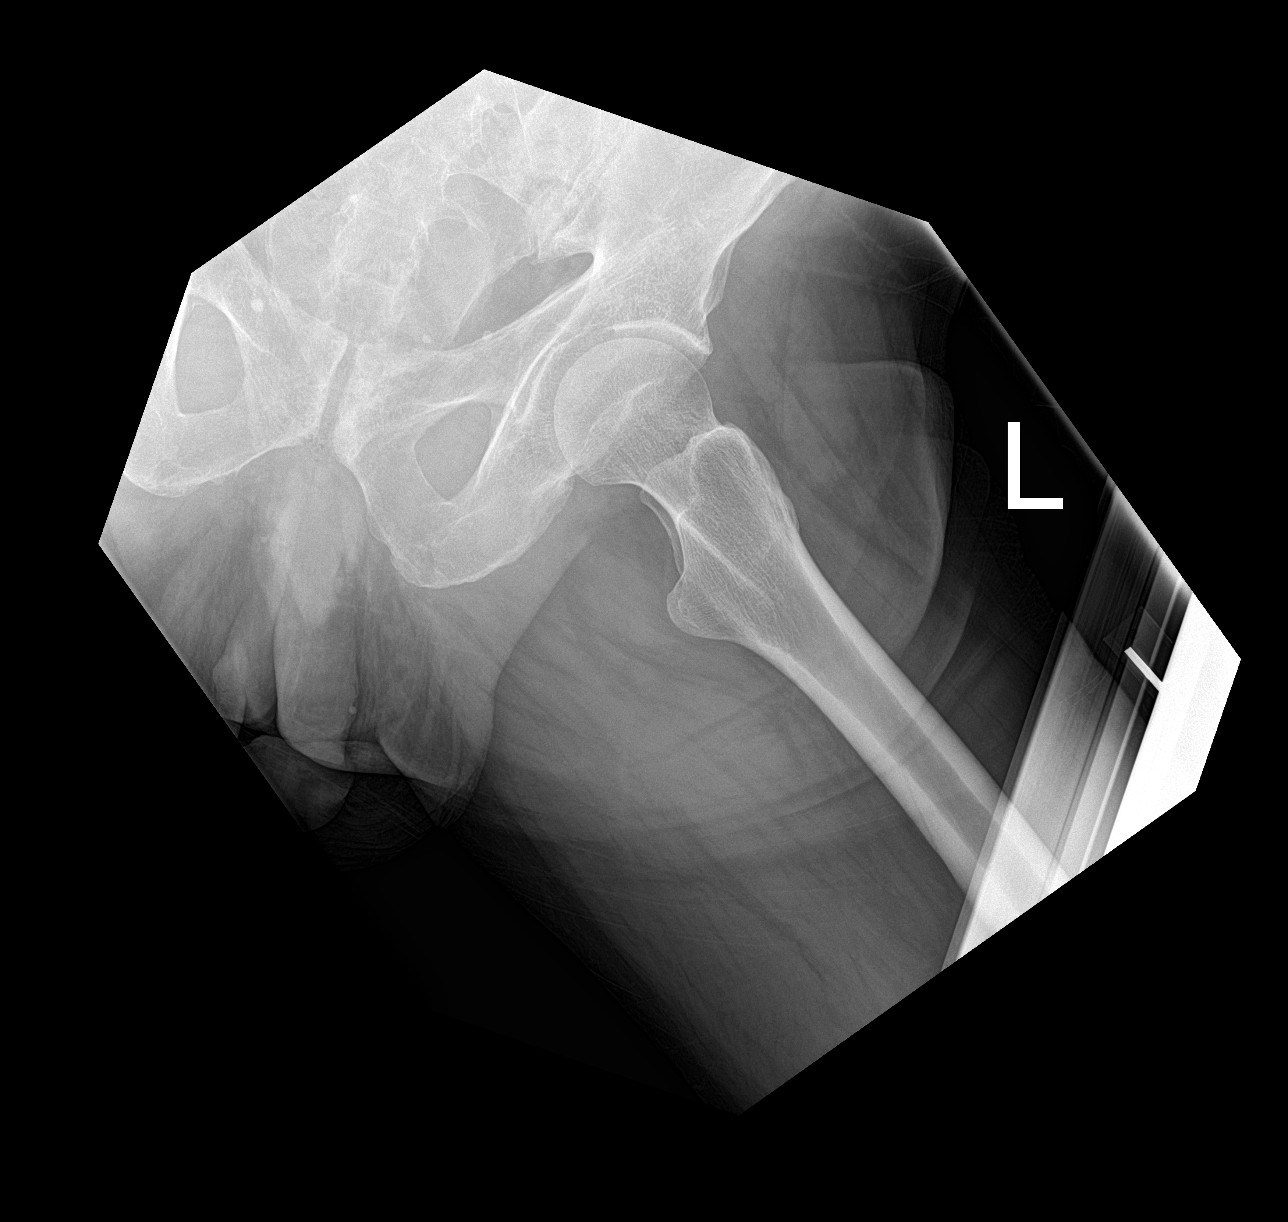

[pelvis ap (2 of 2)]
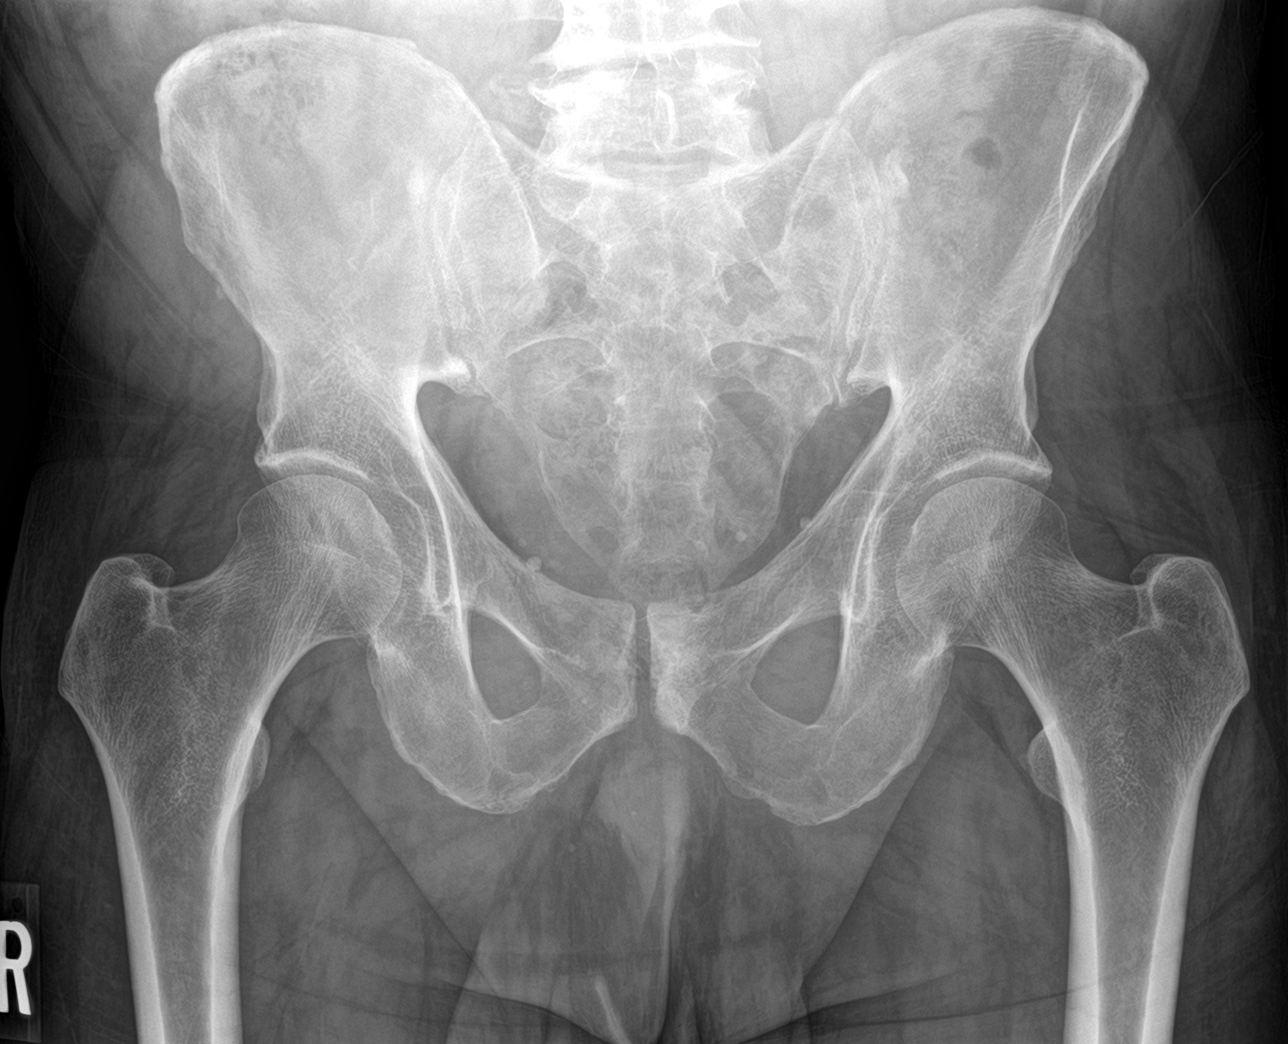

[3 of 3 positions shown; findings below may reference images not displayed]

FINDINGS: There is no evidence of hip fracture or dislocation. There is no
evidence of arthropathy or other focal bone abnormality.
IMPRESSION: Negative.

## 2022-03-17 NOTE — Progress Notes (Signed)
Cardiology Office Note:    Date:  03/18/2022   ID:  Juan Holt, DOB 1961/06/28, MRN [id number]  PCP:  [name], PA-C  Cardiologist:  [name], MD   Referring MD: [name], PA-C   Chief Complaint  Patient presents with   Fatigue   Chest Pain   Advice Only    Elevated blood pressure    History of Present Illness:    Juan Holt is a 61 y.o. male with a hx of recent fatigue and chest discomfort.   His wife spoke to me about an episode of chest discomfort that occurred within the last 7 days.  He was moving a heavy dresser along with some other men and had pressure discomfort in his chest.  He had to stop and rest for several minutes for it to resolve.  They then carried the dresser out to a truck and loaded it without recurrence.  He has never had that feeling before.  Breathing was okay.  There was no diaphoresis or radiation of the discomfort.  He says his chest felt tight.  Today in office his heart rate on EKG is less than 50.  His blood pressure is elevated.  He has no recollection of bradycardia or high blood pressure but does tell me a stress test was done several years ago either here or at [facility].  He cannot tell me any particular diagnosis that was made.  He does not recall any prior history of bradycardia.  Past Medical History:  Diagnosis Date   Hypoglycemia     Past Surgical History:  Procedure Laterality Date   VASECTOMY      Current Medications: No outpatient medications have been marked as taking for the 03/18/22 encounter (Office Visit) with Lyn Records, MD.     Allergies:   Prednisone   Social History   Socioeconomic History   Marital status: Married    Spouse name: Not on file   Number of children: Not on file   Years of education: Not on file   Highest education level: Not on file  Occupational History   Not on file  Tobacco Use   Smoking status: Never   Smokeless tobacco: Never  Substance and Sexual Activity    Alcohol use: Yes    Comment: 2 beers per week   Drug use: Never   Sexual activity: Yes    Partners: Female    Comment: vasectomy  Other Topics Concern   Not on file  Social History Narrative   Not on file   Social Determinants of Health   Financial Resource Strain: Not on file  Food Insecurity: Not on file  Transportation Needs: Not on file  Physical Activity: Not on file  Stress: Not on file  Social Connections: Not on file     Family History: The patient's family history includes Cancer in his father.  ROS:   Please see the history of present illness.    Fatigue and stress are a significant issue.  Having difficulty sleeping because of stress related to work and other issues.  All other systems reviewed and are negative.  EKGs/Labs/Other Studies Reviewed:    The following studies were reviewed today: No prior cardiac imaging or testing.  EKG:  EKG marked sinus bradycardia with heart rate of 48 bpm, prominent voltage, no ST-T abnormality.  Recent Labs: 04/29/2021: ALT 67; BUN 18; Creat 0.84; Hemoglobin 16.9; Platelets 174; Potassium 4.3; Sodium 141  Recent Lipid Panel  Component Value Date/Time   CHOL 191 04/29/2021 0000   TRIG 322 (H) 04/29/2021 0000   HDL 43 04/29/2021 0000   CHOLHDL 4.4 04/29/2021 0000   LDLCALC 105 (H) 04/29/2021 0000    Physical Exam:    VS:  BP (!) 154/90   Pulse (!) 48   Ht 6' (1.829 m)   Wt 201 lb 9.6 oz (91.4 kg)   SpO2 98%   BMI 27.34 kg/m     Wt Readings from Last 3 Encounters:  03/18/22 201 lb 9.6 oz (91.4 kg)  04/29/21 211 lb (95.7 kg)  04/23/20 207 lb (93.9 kg)     GEN: Healthy appearing, bearded.. No acute distress HEENT: Normal NECK: No JVD. LYMPHATICS: No lymphadenopathy CARDIAC: No murmur. RRR no gallop, or edema. VASCULAR:  Normal Pulses. No bruits. RESPIRATORY:  Clear to auscultation without rales, wheezing or rhonchi  ABDOMEN: Soft, non-tender, non-distended, No pulsatile mass, MUSCULOSKELETAL: No deformity   SKIN: Warm and dry NEUROLOGIC:  Alert and oriented x 3 PSYCHIATRIC:  Normal affect   ASSESSMENT:    1. Chest pain of uncertain etiology   2. Elevated blood pressure reading in office with white coat syndrome, without diagnosis of hypertension   3. Sinus bradycardia on ECG   4. Hyperlipidemia LDL goal <100   5. OSA (obstructive sleep apnea)   6. Hypertriglyceridemia    PLAN:    In order of problems listed above:  Occurred in the setting of heavy physical activity and resolved within less than 10 minutes.  Did not recur with similar activity.  Etiology includes the possibility of angina pectoris related to obstructive coronary disease versus supply demand mismatch in the setting of high blood pressure and strenuous physical labor.  Exercise treadmill test to evaluate chronotropic response to exercise and to exclude myocardial ischemia. Exercise treadmill test to evaluate blood pressure response and to rule out underlying untreated hypertension. Could have sinus node dysfunction.  Exercise treadmill test will determine if there is chronotropic incompetence. Lipids are mildly elevated.  A calcium score will be performed.  If significant, statin therapy will be started and further ischemic evaluation may need to be done depending upon results of other tests. We did not discuss sleep apnea.  This needs to be further discussed and circumstances/therapy needs to be considered. None   Plan exercise treadmill testing, coronary calcium score, management of blood pressure/bradycardia/lipids depending upon findings.   Medication Adjustments/Labs and Tests Ordered: Current medicines are reviewed at length with the patient today.  Concerns regarding medicines are outlined above.  Orders Placed This Encounter  Procedures   CT CARDIAC SCORING (SELF PAY ONLY)   EXERCISE TOLERANCE TEST (ETT)   EKG 12-Lead   No orders of the defined types were placed in this encounter.   Patient Instructions   Medication Instructions:  Your physician recommends that you continue on your current medications as directed. Please refer to the Current Medication list given to you today.  *If you need a refill on your cardiac medications before your next appointment, please call your pharmacy*  Lab Work: NONE  Testing/Procedures: Your physician has requested for you to have an exercise tolerance test with treadmill performed.  Your physician has requested for you to have a coronary calcium score performed.  Please schedule ETT and Calcium Score on same day if possible.  Follow-Up: At [facility], you and your health needs are our priority.  As part of our continuing mission to provide you with exceptional heart care, we have  created designated Provider Care Teams.  These Care Teams include your primary Cardiologist (physician) and Advanced Practice Providers (APPs -  Physician Assistants and Nurse Practitioners) who all work together to provide you with the care you need, when you need it.  Your next appointment:   1 month(s)  The format for your next appointment:   In Person  Provider:   [name], MD {   Important Information About Sugar         Signed, [name], MD  03/18/2022 11:56 AM    [facility]

## 2022-03-18 ENCOUNTER — Encounter: Payer: Self-pay | Admitting: Interventional Cardiology

## 2022-03-18 ENCOUNTER — Ambulatory Visit: Payer: 59 | Admitting: Interventional Cardiology

## 2022-03-18 VITALS — BP 154/90 | HR 48 | Ht 72.0 in | Wt 201.6 lb

## 2022-03-18 DIAGNOSIS — R079 Chest pain, unspecified: Secondary | ICD-10-CM | POA: Diagnosis not present

## 2022-03-18 DIAGNOSIS — G4733 Obstructive sleep apnea (adult) (pediatric): Secondary | ICD-10-CM | POA: Diagnosis not present

## 2022-03-18 DIAGNOSIS — E785 Hyperlipidemia, unspecified: Secondary | ICD-10-CM

## 2022-03-18 DIAGNOSIS — E781 Pure hyperglyceridemia: Secondary | ICD-10-CM

## 2022-03-18 DIAGNOSIS — R030 Elevated blood-pressure reading, without diagnosis of hypertension: Secondary | ICD-10-CM

## 2022-03-18 DIAGNOSIS — R001 Bradycardia, unspecified: Secondary | ICD-10-CM | POA: Diagnosis not present

## 2022-03-18 NOTE — Patient Instructions (Signed)
Medication Instructions:  Your physician recommends that you continue on your current medications as directed. Please refer to the Current Medication list given to you today.  *If you need a refill on your cardiac medications before your next appointment, please call your pharmacy*  Lab Work: NONE  Testing/Procedures: Your physician has requested for you to have an exercise tolerance test with treadmill performed.  Your physician has requested for you to have a coronary calcium score performed.  Please schedule ETT and Calcium Score on same day if possible.  Follow-Up: At [facility], you and your health needs are our priority.  As part of our continuing mission to provide you with exceptional heart care, we have created designated Provider Care Teams.  These Care Teams include your primary Cardiologist (physician) and Advanced Practice Providers (APPs -  Physician Assistants and Nurse Practitioners) who all work together to provide you with the care you need, when you need it.  Your next appointment:   1 month(s)  The format for your next appointment:   In Person  Provider:   [name], MD {   Important Information About Sugar

## 2022-03-31 ENCOUNTER — Ambulatory Visit
Admission: RE | Admit: 2022-03-31 | Discharge: 2022-03-31 | Disposition: A | Discharge status: Home / Self Care | Payer: 59 | Source: Ambulatory Visit | Attending: Interventional Cardiology | Admitting: Interventional Cardiology

## 2022-03-31 ENCOUNTER — Ambulatory Visit (INDEPENDENT_AMBULATORY_CARE_PROVIDER_SITE_OTHER): Payer: 59

## 2022-03-31 DIAGNOSIS — R079 Chest pain, unspecified: Secondary | ICD-10-CM | POA: Diagnosis not present

## 2022-03-31 LAB — EXERCISE TOLERANCE TEST
Angina Index: 0
Estimated workload: 7
Exercise duration (min): 6 min
Exercise duration (sec): 0 s
MPHR: 158 {beats}/min
Peak HR: 137 {beats}/min
Percent HR: 86 %
RPE: 17
Rest HR: 60 {beats}/min

## 2022-04-01 ENCOUNTER — Telehealth: Payer: Self-pay

## 2022-04-01 ENCOUNTER — Telehealth: Payer: 59 | Admitting: Physician Assistant

## 2022-04-01 DIAGNOSIS — J019 Acute sinusitis, unspecified: Secondary | ICD-10-CM | POA: Diagnosis not present

## 2022-04-01 DIAGNOSIS — B9689 Other specified bacterial agents as the cause of diseases classified elsewhere: Secondary | ICD-10-CM

## 2022-04-01 MED ORDER — AMOXICILLIN-POT CLAVULANATE 875-125 MG PO TABS: 1.0000 | ORAL_TABLET | Freq: Two times a day (BID) | ORAL | 0 refills | Status: DC

## 2022-04-01 NOTE — Progress Notes (Signed)
I have spent 5 minutes in review of e-visit questionnaire, review and updating patient chart, medical decision making and response to patient.   Theador Jezewski Cody Dequavion Follette, PA-C    

## 2022-04-01 NOTE — Telephone Encounter (Signed)
-----   Message from Belva Crome, MD sent at 03/31/2022  7:45 PM EDT ----- Let the patient know the coronary calcium score is very low (not a lot of plaque in arteries, the stress test was abnormal due to severe BP elevation, and the aorta is mildly dilated. All this suggests that the BP is a problem. We need to achieve good BP controlled and then if still having chest pain will need further w/u.Start Losartan Hctz 50/12.5 mg daily. Report to the BP clinic in 2 weeks for f/u and BMET. Notify me if continues to have CP or if BP not < 140/90 mmHg. In that case therapy will be up titrated.HR increases appropriately with exercise. A copy will be sent to Stacie Glaze, DO

## 2022-04-01 NOTE — Progress Notes (Signed)

## 2022-04-01 NOTE — Telephone Encounter (Signed)
Left message for patient to return call to discuss results of calcium score and ETT with Dr. Michaelle Copas recommendations.

## 2022-04-02 ENCOUNTER — Telehealth: Payer: Self-pay

## 2022-04-02 NOTE — Telephone Encounter (Signed)
Opened in error. See phone note from 04/01/22.

## 2022-04-02 NOTE — Telephone Encounter (Signed)
Spoke with patient and discussed results of calcium score and ETT.  Per Dr. Katrinka Blazing: Let the patient know the coronary calcium score is very low (not a lot of plaque in arteries, the stress test was abnormal due to severe BP elevation, and the aorta is mildly dilated. All this suggests that the BP is a problem. We need to achieve good BP controlled and then if still having chest pain will need further w/u.Start Losartan Hctz 50/12.5 mg daily. Report to the BP clinic in 2 weeks for f/u and BMET. Notify me if continues to have CP or if BP not < 140/90 mmHg. In that case therapy will be up titrated.HR increases appropriately with exercise.  Patient states he does not like the idea of taking medication. He states he believes his elevated BP and CP are related to stress he has going on in his life. He states he would like to work on stress management himself and then follow-up with Dr. Katrinka Blazing at his appt on 04/22/22 to then discuss next steps.  Will forward to Dr. Katrinka Blazing to review.

## 2022-04-02 NOTE — Telephone Encounter (Signed)
Pt is returning call.  

## 2022-04-21 NOTE — Progress Notes (Signed)
Cardiology Office Note:    Date:  04/22/2022   ID:  Juan Holt, DOB [birth year], MRN [id number]  PCP:  [name], PA-C  Cardiologist:  [name], MD   Referring MD: Nolene Ebbs   Chief Complaint  Patient presents with   Hypertension    History of Present Illness:    Juan Holt is a 61 y.o. male with a hx of fatigue and chest discomfort.  Test Dupin, his wife is a Engineer, civil (consulting) in the cardiac Cath Lab.   Blood pressure recordings at home have been elevated.  Only 1 normal recording of multiple.  Some blood pressures greater than 200 mmHg.  On the treadmill his blood pressure got to 250/100 mmHg with no symptoms.  No evidence of ischemia and no exercise-induced chest pain.  His experience with medication therapy father thinks has been 1 of significant side effects and he is not been willing to accept that he has high blood pressure.  I recommended losartan HCTZ 50/12.5 mg daily but he refused to take it.  He is back today for reassessment.  He has had some back discomfort.  The coronary calcium score revealed a dilated aorta at 4 cm.  His father had a ruptured abdominal aortic aneurysm which he survived.  Past Medical History:  Diagnosis Date   Hypoglycemia     Past Surgical History:  Procedure Laterality Date   VASECTOMY      Current Medications: Current Meds  Medication Sig   losartan-hydrochlorothiazide (HYZAAR) 50-12.5 MG tablet Take 1 tablet by mouth daily.     Allergies:   Prednisone   Social History   Socioeconomic History   Marital status: Married    Spouse name: Not on file   Number of children: Not on file   Years of education: Not on file   Highest education level: Not on file  Occupational History   Not on file  Tobacco Use   Smoking status: Never   Smokeless tobacco: Never  Substance and Sexual Activity   Alcohol use: Yes    Comment: 2 beers per week   Drug use: Never   Sexual activity: Yes    Partners: Female    Comment:  vasectomy  Other Topics Concern   Not on file  Social History Narrative   Not on file   Social Determinants of Health   Financial Resource Strain: Not on file  Food Insecurity: Not on file  Transportation Needs: Not on file  Physical Activity: Not on file  Stress: Not on file  Social Connections: Not on file     Family History: The patient's family history includes Cancer in his father.  ROS:   Please see the history of present illness.    Lot of anxiety, work stress, family stress, and has a difficult time controlling his emotions.  All other systems reviewed and are negative.  EKGs/Labs/Other Studies Reviewed:    The following studies were reviewed today: ETT 2021/11/22 Study Highlights      Patient exercised according to [facility] protocol for 6:56min achieving 7. .   Target HR was achieved (max HR 137bpm; 86%MPHR)   Hypertensive response to exercise with peak BP 250/173mmHg   There were up sloping ST depression in the inferolateral leads at peak exercise (II, III, aVF, V4, V5 and V6). This may represent ischemia, however, may also be related to significant hypertension at peak stress. Recommend further evaluation with imaging (myoview or coronary CTA).  CORONARY CALCIUM SCORE 03/31/2022: IMPRESSION:  Coronary calcium score of 12.2. This was 36th percentile for age-, race-, and sex-matched controls.   Ascending Aorta: Mildly dilated at 40mm. Recommend gated Chest CTA for further evaluation.  EKG:  EKG not repeated  Recent Labs: 04/29/2021: ALT 67; BUN 18; Creat 0.84; Hemoglobin 16.9; Platelets 174; Potassium 4.3; Sodium 141  Recent Lipid Panel    Component Value Date/Time   CHOL 191 04/29/2021 0000   TRIG 322 (H) 04/29/2021 0000   HDL 43 04/29/2021 0000   CHOLHDL 4.4 04/29/2021 0000   LDLCALC 105 (H) 04/29/2021 0000    Physical Exam:    VS:  BP 122/78   Pulse 74   Ht 6' (1.829 m)   Wt 201 lb 9.6 oz (91.4 kg)   SpO2 98%   BMI 27.34 kg/m     Wt Readings from  Last 3 Encounters:  04/22/22 201 lb 9.6 oz (91.4 kg)  03/18/22 201 lb 9.6 oz (91.4 kg)  04/29/21 211 lb (95.7 kg)    Left arm blood pressure 150/90 mmHg; right arm blood pressure 138/75 mmHg GEN: Healthy appearing, bearded. No acute distress HEENT: Normal NECK: No JVD. LYMPHATICS: No lymphadenopathy CARDIAC: No murmur. RRR no gallop, or edema. VASCULAR:  Normal Pulses. No bruits. RESPIRATORY:  Clear to auscultation without rales, wheezing or rhonchi  ABDOMEN: Soft, non-tender, non-distended, No pulsatile mass, MUSCULOSKELETAL: No deformity  SKIN: Warm and dry NEUROLOGIC:  Alert and oriented x 3 PSYCHIATRIC:  Normal affect   ASSESSMENT:    1. Essential (primary) hypertension   2. Aortic dilatation (HCC)   3. Chest pain of uncertain etiology   4. Sinus bradycardia on ECG   5. Hyperlipidemia LDL goal <100    PLAN:    In order of problems listed above:  Vague, still having some episodes in the setting of significant blood pressure elevation.  Negative exercise test for ischemia.  He did have upsloping ST segment abnormality.  I believe we need to get his blood pressure under better control before any further ischemic evaluation.  He does have a dilated aorta on coronary CTA.  We will order a CT scan of the aorta with contrast to exclude dissection/aneurysm.  Start losartan HCT 50/12.5 mg/day.  Low-dose beta-blocker may be added thereafter.  Measure blood pressure every day at least 2 hours after antihypertensive therapy.  Clinical follow-up in 4 to 6 weeks. Aortic CTA.  After noting he had aortic enlargement and extreme blood pressure elevation on treadmill, I attempted to start antihypertensive therapy but he refused. Still have concern about the chest discomfort.  If he continues to have it after blood pressure is controlled, may need to have coronary CTA Monitor.  Care with adding beta-blocker therapy given bradycardia. He is antimedication.  We will see how he does on losartan  HCT.   Medication Adjustments/Labs and Tests Ordered: Current medicines are reviewed at length with the patient today.  Concerns regarding medicines are outlined above.  Orders Placed This Encounter  Procedures   CT ANGIO CHEST AORTA W/CM & OR WO/CM   Meds ordered this encounter  Medications   losartan-hydrochlorothiazide (HYZAAR) 50-12.5 MG tablet    Sig: Take 1 tablet by mouth daily.    Dispense:  90 tablet    Refill:  3    Patient Instructions  Medication Instructions:  Your physician has recommended you make the following change in your medication:   1) START Losartan-HCTZ (Hyzaar) 50-12.5mg  daily  *If you need a refill on your cardiac medications before your next  appointment, please call your pharmacy*  Lab Work: NONE  Testing/Procedures: Your physician has recommended you have a CT scan of your aorta performed.  Follow-Up: At [facility], you and your health needs are our priority.  As part of our continuing mission to provide you with exceptional heart care, we have created designated Provider Care Teams.  These Care Teams include your primary Cardiologist (physician) and Advanced Practice Providers (APPs -  Physician Assistants and Nurse Practitioners) who all work together to provide you with the care you need, when you need it.  Your next appointment:   4-5 week(s)  The format for your next appointment:   In Person  Provider:   [name], MD {  Other Instructions Check your blood pressure at home daily, checking it at least 2 hours after taking blood pressure medication. Keep a list of your readings to be reviewed by Dr. Katrinka Blazing at follow-up visit. Please take your blood pressure in your left arm only, your goal is to consistently have readings 130/80 mmHg or less.  HOW TO TAKE YOUR BLOOD PRESSURE: Rest 5 minutes before taking your blood pressure.  Don't smoke or drink caffeinated beverages for at least 30 minutes before. Take your blood pressure  before (not after) you eat. Sit comfortably with your back supported and both feet on the floor (don't cross your legs). Elevate your arm to heart level on a table or a desk. Use the proper sized cuff. It should fit smoothly and snugly around your bare upper arm. There should be enough room to slip a fingertip under the cuff. The bottom edge of the cuff should be 1 inch above the crease of the elbow.   Important Information About Sugar         Signed, [name], MD  04/22/2022 2:44 PM    [facility]

## 2022-04-22 ENCOUNTER — Other Ambulatory Visit (HOSPITAL_COMMUNITY): Payer: Self-pay

## 2022-04-22 ENCOUNTER — Ambulatory Visit: Payer: 59 | Admitting: Interventional Cardiology

## 2022-04-22 ENCOUNTER — Encounter: Payer: Self-pay | Admitting: Interventional Cardiology

## 2022-04-22 VITALS — BP 122/78 | HR 74 | Ht 72.0 in | Wt 201.6 lb

## 2022-04-22 DIAGNOSIS — R030 Elevated blood-pressure reading, without diagnosis of hypertension: Secondary | ICD-10-CM

## 2022-04-22 DIAGNOSIS — E785 Hyperlipidemia, unspecified: Secondary | ICD-10-CM

## 2022-04-22 DIAGNOSIS — I77819 Aortic ectasia, unspecified site: Secondary | ICD-10-CM | POA: Diagnosis not present

## 2022-04-22 DIAGNOSIS — R001 Bradycardia, unspecified: Secondary | ICD-10-CM

## 2022-04-22 DIAGNOSIS — R079 Chest pain, unspecified: Secondary | ICD-10-CM

## 2022-04-22 DIAGNOSIS — I10 Essential (primary) hypertension: Secondary | ICD-10-CM | POA: Diagnosis not present

## 2022-04-22 MED ORDER — LOSARTAN POTASSIUM-HCTZ 50-12.5 MG PO TABS
1.0000 | ORAL_TABLET | Freq: Every day | ORAL | 3 refills | Status: DC
  Filled 2022-04-22: qty 90, 90d supply, fill #0

## 2022-04-22 NOTE — Patient Instructions (Addendum)
Medication Instructions:  Your physician has recommended you make the following change in your medication:   1) START Losartan-HCTZ (Hyzaar) 50-12.5mg  daily  *If you need a refill on your cardiac medications before your next appointment, please call your pharmacy*  Lab Work: NONE  Testing/Procedures: Your physician has recommended you have a CT scan of your aorta performed.  Follow-Up: At [facility], you and your health needs are our priority.  As part of our continuing mission to provide you with exceptional heart care, we have created designated Provider Care Teams.  These Care Teams include your primary Cardiologist (physician) and Advanced Practice Providers (APPs -  Physician Assistants and Nurse Practitioners) who all work together to provide you with the care you need, when you need it.  Your next appointment:   4-5 week(s)  The format for your next appointment:   In Person  Provider:   [name], MD {  Other Instructions Check your blood pressure at home daily, checking it at least 2 hours after taking blood pressure medication. Keep a list of your readings to be reviewed by Dr. Katrinka Blazing at follow-up visit. Please take your blood pressure in your left arm only, your goal is to consistently have readings 130/80 mmHg or less.  HOW TO TAKE YOUR BLOOD PRESSURE: Rest 5 minutes before taking your blood pressure.  Don't smoke or drink caffeinated beverages for at least 30 minutes before. Take your blood pressure before (not after) you eat. Sit comfortably with your back supported and both feet on the floor (don't cross your legs). Elevate your arm to heart level on a table or a desk. Use the proper sized cuff. It should fit smoothly and snugly around your bare upper arm. There should be enough room to slip a fingertip under the cuff. The bottom edge of the cuff should be 1 inch above the crease of the elbow.   Important Information About Sugar

## 2022-04-25 ENCOUNTER — Ambulatory Visit (HOSPITAL_BASED_OUTPATIENT_CLINIC_OR_DEPARTMENT_OTHER)
Admission: RE | Admit: 2022-04-25 | Discharge: 2022-04-25 | Disposition: A | Discharge status: Home / Self Care | Payer: 59 | Source: Ambulatory Visit | Attending: Interventional Cardiology | Admitting: Interventional Cardiology

## 2022-04-25 DIAGNOSIS — R079 Chest pain, unspecified: Secondary | ICD-10-CM | POA: Insufficient documentation

## 2022-04-25 DIAGNOSIS — E785 Hyperlipidemia, unspecified: Secondary | ICD-10-CM | POA: Insufficient documentation

## 2022-04-25 DIAGNOSIS — I7121 Aneurysm of the ascending aorta, without rupture: Secondary | ICD-10-CM | POA: Diagnosis not present

## 2022-04-25 MED ORDER — IOHEXOL 350 MG/ML SOLN
100.0000 mL | Freq: Once | INTRAVENOUS | Status: AC | PRN
  Administered 2022-04-25: 100 mL via INTRAVENOUS

## 2022-05-27 NOTE — Progress Notes (Addendum)
Cardiology Office Note:    Date:  05/29/2022   ID:  Juan Holt, DOB 27-Jul-1961, MRN [id number]  PCP:  [name], PA-C  Cardiologist:  [name], MD   Referring MD: [name], [place]   Chief Complaint  Patient presents with   Hypertension   Hyperlipidemia   Follow-up    Dilated aorta    History of Present Illness:    Juan Holt is a 61 y.o. male with a hx of primary hypertension, dilated aorta, chest pain, sinus bradycardia, , and hyperlipidemia.    He continues to have sinus bradycardia.  On full dose losartan HCT he would have significant weakness and feel dizzy.  He takes his medication at night.  He has been biting the tablet in half.  This lightheaded symptoms have resolved.  No significant or limiting chest pain instances.  Past Medical History:  Diagnosis Date   Hypoglycemia     Past Surgical History:  Procedure Laterality Date   VASECTOMY      Current Medications: Current Meds  Medication Sig   losartan-hydrochlorothiazide (HYZAAR) 50-12.5 MG tablet Take 1 tablet by mouth daily.     Allergies:   Prednisone   Social History   Socioeconomic History   Marital status: Married    Spouse name: Not on file   Number of children: Not on file   Years of education: Not on file   Highest education level: Not on file  Occupational History   Not on file  Tobacco Use   Smoking status: Never   Smokeless tobacco: Never  Substance and Sexual Activity   Alcohol use: Yes    Comment: 2 beers per week   Drug use: Never   Sexual activity: Yes    Partners: Female    Comment: vasectomy  Other Topics Concern   Not on file  Social History Narrative   Not on file   Social Determinants of Health   Financial Resource Strain: Not on file  Food Insecurity: Not on file  Transportation Needs: Not on file  Physical Activity: Not on file  Stress: Not on file  Social Connections: Not on file     Family History: The patient's family history  includes Cancer in his father.  ROS:   Please see the history of present illness.    Overall in good spirits and seems to be doing well.  Has limited salt intake.  All other systems reviewed and are negative.  EKGs/Labs/Other Studies Reviewed:    The following studies were reviewed today: CT Angio Chest 04/25/2022: IMPRESSION: 1. 4 cm ascending aortic aneurysm. Recommend annual imaging followup by CTA or MRA. This recommendation follows 2010 ACCF/AHA/AATS/ACR/ASA/SCA/SCAI/SIR/STS/SVM Guidelines for the Diagnosis and Management of Patients with Thoracic Aortic Disease. Circulation. 2010; 121: N829-F621. Aortic aneurysm NOS (ICD10-I71.9) 2. No other acute cardiopulmonary process.   ETT 03/31/2022: Study Highlights      Patient exercised according to [facility] protocol for 6:73min achieving 7. .   Target HR was achieved (max HR 137bpm; 86%MPHR)   Hypertensive response to exercise with peak BP 250/170mmHg   There were up sloping ST depression in the inferolateral leads at peak exercise (II, III, aVF, V4, V5 and V6). This may represent ischemia, however, may also be related to significant hypertension at peak stress. Recommend further evaluation with imaging (myoview or coronary CTA).     EKG:  EKG not repeated  Recent Labs: No results found for requested labs within last 365 days.  Recent Lipid Panel  Component Value Date/Time   CHOL 191 04/29/2021 0000   TRIG 322 (H) 04/29/2021 0000   HDL 43 04/29/2021 0000   CHOLHDL 4.4 04/29/2021 0000   LDLCALC 105 (H) 04/29/2021 0000    Physical Exam:    VS:  BP 124/74   Pulse (!) 45   Ht 6' (1.829 m)   Wt 201 lb 12.8 oz (91.5 kg)   SpO2 98%   BMI 27.37 kg/m     Wt Readings from Last 3 Encounters:  05/29/22 201 lb 12.8 oz (91.5 kg)  04/22/22 201 lb 9.6 oz (91.4 kg)  03/18/22 201 lb 9.6 oz (91.4 kg)     GEN: Healthy appearing, bearded.. No acute distress HEENT: Normal NECK: No JVD. LYMPHATICS: No lymphadenopathy CARDIAC:  No murmur. RRR S4 gallop, or edema. VASCULAR:  Normal Pulses. No bruits. RESPIRATORY:  Clear to auscultation without rales, wheezing or rhonchi  ABDOMEN: Soft, non-tender, non-distended, No pulsatile mass, MUSCULOSKELETAL: No deformity  SKIN: Warm and dry NEUROLOGIC:  Alert and oriented x 3 PSYCHIATRIC:  Normal affect   ASSESSMENT:    1. Essential (primary) hypertension   2. Aortic dilatation (HCC)   3. Chest pain of uncertain etiology   4. Hyperlipidemia LDL goal <100   5. Sinus bradycardia on ECG   6. OSA (obstructive sleep apnea)    PLAN:    In order of problems listed above:  Control blood pressure on losartan 25 mg/HCTZ 6.25 mg/day.  Target is 130/80 or less.  Unable to use beta-blocker because of intrinsic slow heart rate.  Continue low-salt diet. Continue the current therapy.  Unable to use beta-blocker therapy because he has low heart rate.  Losartan is being used with good results.  Will schedule chest CT for 1 year following the prior study.  This will be to follow-up on the size of the aorta. Random, not exertional, with plans to continue to observe.  Did tell him that if he continues to have discomfort or if it becomes quality of life changing or prevents physical activity as it is required, he may need to have coronary CTA or coronary angiography. His LDL is 105.  We have not identified any significant coronary disease.  Continue clinical follow-up. Persists. Encouraged CPAP compliance.     70-month follow-up, he prefers Dr.Varanasi    Medication Adjustments/Labs and Tests Ordered: Current medicines are reviewed at length with the patient today.  Concerns regarding medicines are outlined above.  No orders of the defined types were placed in this encounter.  No orders of the defined types were placed in this encounter.   There are no Patient Instructions on file for this visit.   Signed, [name], MD  05/29/2022 8:45 AM    [facility]

## 2022-05-29 ENCOUNTER — Encounter: Payer: Self-pay | Admitting: Interventional Cardiology

## 2022-05-29 ENCOUNTER — Ambulatory Visit: Payer: 59 | Admitting: Interventional Cardiology

## 2022-05-29 VITALS — BP 124/74 | HR 45 | Ht 72.0 in | Wt 201.8 lb

## 2022-05-29 DIAGNOSIS — I77819 Aortic ectasia, unspecified site: Secondary | ICD-10-CM | POA: Diagnosis not present

## 2022-05-29 DIAGNOSIS — I10 Essential (primary) hypertension: Secondary | ICD-10-CM | POA: Diagnosis not present

## 2022-05-29 DIAGNOSIS — G4733 Obstructive sleep apnea (adult) (pediatric): Secondary | ICD-10-CM | POA: Diagnosis not present

## 2022-05-29 DIAGNOSIS — R079 Chest pain, unspecified: Secondary | ICD-10-CM

## 2022-05-29 DIAGNOSIS — R001 Bradycardia, unspecified: Secondary | ICD-10-CM | POA: Diagnosis not present

## 2022-05-29 DIAGNOSIS — E785 Hyperlipidemia, unspecified: Secondary | ICD-10-CM

## 2022-05-29 MED ORDER — LOSARTAN POTASSIUM-HCTZ 50-12.5 MG PO TABS
0.5000 | ORAL_TABLET | Freq: Every day | ORAL | Status: DC
Start: 2022-05-29 — End: 2022-08-20

## 2022-05-29 NOTE — Patient Instructions (Signed)
Medication Instructions:  Your physician has recommended you make the following change in your medication:   1) DECREASE Losartan-hydrochlorothiazide (Hyzaar) to half tablet daily (25-6.25mg )  **Please purchase pill cutter/splitter to cut tablets in half for more accurate dosing.**  *If you need a refill on your cardiac medications before your next appointment, please call your pharmacy*   Lab Work: NONE  Testing/Procedures: Your physician has recommended you have a CT angio of chest/aorta performed in July 2024.  Follow-Up: At [facility], you and your health needs are our priority.  As part of our continuing mission to provide you with exceptional heart care, we have created designated Provider Care Teams.  These Care Teams include your primary Cardiologist (physician) and Advanced Practice Providers (APPs -  Physician Assistants and Nurse Practitioners) who all work together to provide you with the care you need, when you need it.  Your next appointment:   6 month(s)  The format for your next appointment:   In Person  Provider:   Lance Muss, MD  Important Information About Sugar

## 2022-08-11 ENCOUNTER — Other Ambulatory Visit (HOSPITAL_COMMUNITY): Payer: Self-pay

## 2022-08-11 ENCOUNTER — Other Ambulatory Visit: Payer: Self-pay | Admitting: Interventional Cardiology

## 2022-08-11 MED ORDER — LOSARTAN POTASSIUM-HCTZ 50-12.5 MG PO TABS
1.0000 | ORAL_TABLET | Freq: Every day | ORAL | 0 refills | Status: DC
  Filled 2022-08-11: qty 45, 45d supply, fill #0

## 2022-08-21 ENCOUNTER — Other Ambulatory Visit (HOSPITAL_COMMUNITY): Payer: Self-pay

## 2022-08-21 ENCOUNTER — Encounter: Payer: Self-pay | Admitting: Physician Assistant

## 2022-08-21 ENCOUNTER — Ambulatory Visit (INDEPENDENT_AMBULATORY_CARE_PROVIDER_SITE_OTHER): Payer: 59

## 2022-08-21 ENCOUNTER — Ambulatory Visit: Payer: 59 | Admitting: Physician Assistant

## 2022-08-21 VITALS — BP 127/68 | HR 51 | Wt 209.0 lb

## 2022-08-21 DIAGNOSIS — M79671 Pain in right foot: Secondary | ICD-10-CM | POA: Diagnosis not present

## 2022-08-21 DIAGNOSIS — M79674 Pain in right toe(s): Secondary | ICD-10-CM | POA: Diagnosis not present

## 2022-08-21 DIAGNOSIS — M778 Other enthesopathies, not elsewhere classified: Secondary | ICD-10-CM | POA: Insufficient documentation

## 2022-08-21 DIAGNOSIS — M19079 Primary osteoarthritis, unspecified ankle and foot: Secondary | ICD-10-CM

## 2022-08-21 MED ORDER — DICLOFENAC SODIUM 75 MG PO TBEC
75.0000 mg | DELAYED_RELEASE_TABLET | Freq: Two times a day (BID) | ORAL | 0 refills | Status: DC
  Filled 2022-08-21: qty 60, 30d supply, fill #0

## 2022-08-21 MED ORDER — DICLOFENAC SODIUM 1 % EX GEL
4.0000 g | Freq: Four times a day (QID) | CUTANEOUS | 1 refills | Status: DC
  Filled 2022-08-21: qty 100, 25d supply, fill #0

## 2022-08-21 NOTE — Progress Notes (Signed)
No fracture. Mild arthritis.

## 2022-08-21 NOTE — Patient Instructions (Signed)
Ice great toe at least 15 minutes twice a day Use diclofenac gel as needed up to 4 times a day Start stretches Stay in post op shoe as much as you can Can take oral diclofenac twice a day for inflammation

## 2022-08-21 NOTE — Progress Notes (Signed)
Acute Office Visit  Subjective:     Patient ID: Juan Holt, male    DOB: [birth year], 61 y.o.   MRN: [id number]  No chief complaint on file.   HPI Patient is in today for right great toe and foot pain for the last 2 days. 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 He kicked it open the other day and had pain in his toe ever since. Denies any warmth or redness. Pain is worse when his toes are flexed or extended. Ibuprofen helps some. It does hurt to walk, even with supportive shoe. He wonders if it is fractured.   .. Active Ambulatory Problems    Diagnosis Date Noted   Muscle cramps 10/30/2019   Reactive hypoglycemia 10/30/2019   Hyperlipidemia LDL goal <100 10/30/2019   Bilateral thumb pain 10/30/2019   Right wrist injury 11/16/2019   Hand injury, left, initial encounter 12/19/2019   Corn of toe 01/08/2020   OSA (obstructive sleep apnea) 04/23/2020   Hypertriglyceridemia 04/24/2020   Vitamin D insufficiency 04/24/2020   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   Benign prostatic hyperplasia with urinary frequency 04/24/2020   Chronic fatigue 04/24/2020   Left hip pain 04/24/2020   Acute meniscal tear of left knee 04/24/2020   Low testosterone in male 04/29/2021   Gastroesophageal reflux disease with esophagitis without hemorrhage 04/29/2021   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     Objective:    BP 127/68   Pulse (!) 51   Wt 209 lb (94.8 kg)   BMI 28.35 kg/m  BP Readings from Last 3 Encounters:  08/21/22 127/68  05/29/22 124/74  04/22/22 122/78   Wt Readings from Last 3 Encounters:  08/21/22 209 lb (94.8 kg)  05/29/22 201 lb 12.8 oz (91.5 kg)  04/22/22 201 lb 9.6 oz (91.4 kg)      Physical  Exam Constitutional:      Appearance: Normal appearance.  HENT:     Head: Normocephalic.  Cardiovascular:     Rate and Rhythm: Normal rate.  Musculoskeletal:     Comments: Right great toe without redness, swelling, warmth or tenderness to palpation.  Right great toe very tender to hallicus longus extension  Neurological:     Mental Status: He is alert.  Psychiatric:        Mood and Affect: Mood normal.          Assessment & Plan:  Marland KitchenMarland KitchenDiagnoses and all orders for this visit:  Flexor hallucis longus tendinitis -     diclofenac (VOLTAREN) 75 MG EC tablet; Take 1 tablet (75 mg total) by mouth 2 (two) times daily. -     diclofenac Sodium (VOLTAREN) 1 % GEL; Apply 4 g topically 4 (four) times daily. To affected joint.  Acute foot pain, right -     DG Foot Complete Right; Future -     diclofenac (VOLTAREN) 75 MG EC tablet; Take 1 tablet (75 mg total) by mouth 2 (two) times daily. -     diclofenac Sodium (VOLTAREN) 1 % GEL; Apply 4 g topically 4 (four) times daily. To affected joint.  Pain of right great toe -     DG Foot Complete Right; Future -  diclofenac (VOLTAREN) 75 MG EC tablet; Take 1 tablet (75 mg total) by mouth 2 (two) times daily. -     diclofenac Sodium (VOLTAREN) 1 % GEL; Apply 4 g topically 4 (four) times daily. To affected joint.  Primary localized osteoarthritis of toe   Xray showed no fracture but some mild arthritis in great toe Post op shoe did not really help Use bunion strap compression brace Ice 15 minutes twice a day Voltaren gel and oral tablets Rest as you can Start hallucis longus exercises Follow up as needed in 2-3 weeks if not improving with sports medicine   Iran Planas, PA-C

## 2022-08-27 DIAGNOSIS — H5203 Hypermetropia, bilateral: Secondary | ICD-10-CM | POA: Diagnosis not present

## 2022-08-27 DIAGNOSIS — H52223 Regular astigmatism, bilateral: Secondary | ICD-10-CM | POA: Diagnosis not present

## 2022-08-27 DIAGNOSIS — H524 Presbyopia: Secondary | ICD-10-CM | POA: Diagnosis not present

## 2022-08-27 NOTE — Progress Notes (Signed)
Cardiology Office Note:    Date:  08/28/2022   ID:  Juan Holt, DOB [birth year], MRN [id number]  PCP:  Nolene Ebbs   [facility] Providers Cardiologist:  [name], MD     Referring MD: [name], [place]   Chief Complaint  Patient presents with   Chest Pain    History of Present Illness:    Juan Holt is a 61 y.o. male seen as a work in for evaluation of chest pain. He is followed by Dr Katrinka Blazing. His wife Juan Holt is one of our cath lab staff. He has a history of sinus brady and HTN. He has mild enlargement of the thoracic aorta 4.0 cm. He had an ETT in June showing hypertensive BP response and upsloping ST depression. 6 minutes on Bruce protocol. Coronary calcium score was low at 12.2. He has OSA and is on CPAP.   The patient reports over the past 2 weeks he has experienced pain in his mid back and anterior chest with tightness. Aggravated when he is upset or stressed. No known alleviating factors. Hasn't felt like himself and has low energy. Pain is constant. No radiation to arms. No abdominal pain. No nausea or diaphoresis. Reports he has been under a lot of stress. Works as an Journalist, newspaper.   Past Medical History:  Diagnosis Date   Hypoglycemia     Past Surgical History:  Procedure Laterality Date   VASECTOMY      Current Medications: Current Meds  Medication Sig   diclofenac Sodium (VOLTAREN) 1 % GEL Apply 4 g topically 4 (four) times daily. To affected joint.   losartan-hydrochlorothiazide (HYZAAR) 50-12.5 MG tablet Take 1 tablet by mouth daily.   metoprolol tartrate (LOPRESSOR) 25 MG tablet Take 25 mg  2 hours before Coronary CT     Allergies:   Prednisone   Social History   Socioeconomic History   Marital status: Married    Spouse name: Not on file   Number of children: Not on file   Years of education: Not on file   Highest education level: Not on file  Occupational History   Not on file  Tobacco Use   Smoking status: Never    Smokeless tobacco: Never  Substance and Sexual Activity   Alcohol use: Yes    Comment: 2 beers per week   Drug use: Never   Sexual activity: Yes    Partners: Female    Comment: vasectomy  Other Topics Concern   Not on file  Social History Narrative   Not on file   Social Determinants of Health   Financial Resource Strain: Not on file  Food Insecurity: Not on file  Transportation Needs: Not on file  Physical Activity: Not on file  Stress: Not on file  Social Connections: Not on file     Family History: The patient's family history includes Cancer in his father.  ROS:   Please see the history of present illness.     All other systems reviewed and are negative.  EKGs/Labs/Other Studies Reviewed:    The following studies were reviewed today: CT Angio Chest 04/25/2022: IMPRESSION: 1. 4 cm ascending aortic aneurysm. Recommend annual imaging followup by CTA or MRA. This recommendation follows 2010 ACCF/AHA/AATS/ACR/ASA/SCA/SCAI/SIR/STS/SVM Guidelines for the Diagnosis and Management of Patients with Thoracic Aortic Disease. Circulation. 2010; 121: N829-F621. Aortic aneurysm NOS (ICD10-I71.9) 2. No other acute cardiopulmonary process.   ETT 03/31/2022: Study Highlights       Patient exercised  according to [facility] protocol for 6:70min achieving 7. .   Target HR was achieved (max HR 137bpm; 86%MPHR)   Hypertensive response to exercise with peak BP 250/145mmHg   There were up sloping ST depression in the inferolateral leads at peak exercise (II, III, aVF, V4, V5 and V6). This may represent ischemia, however, may also be related to significant hypertension at peak stress. Recommend further evaluation with imaging (myoview or coronary CTA).      Coronary Calcium Score   TECHNIQUE: A gated, non-contrast computed tomography scan of the heart was performed using 71mm slice thickness. Axial images were analyzed on a dedicated workstation. Calcium scoring of the coronary  arteries was performed using the Agatston method.   FINDINGS: Coronary arteries: Normal origins.   Coronary Calcium Score:   Left main: 0   Left anterior descending artery:12.2   Left circumflex artery: 0   Right coronary artery: 0   Total: 12.2   Percentile: 36th   Pericardium: Normal.   Ascending Aorta: Mildly dilated at 59mm. Recommend gated Chest CTA for further evaluation.   Non-cardiac: See separate report from [facility].   IMPRESSION: Coronary calcium score of 12.2. This was 36th percentile for age-, race-, and sex-matched controls.    EKG:  EKG is  ordered today.  The ekg ordered today demonstrates NSR rate 57. Normal. No change from prior.   Recent Labs: No results found for requested labs within last 365 days.  Recent Lipid Panel    Component Value Date/Time   CHOL 191 04/29/2021 0000   TRIG 322 (H) 04/29/2021 0000   HDL 43 04/29/2021 0000   CHOLHDL 4.4 04/29/2021 0000   LDLCALC 105 (H) 04/29/2021 0000     Risk Assessment/Calculations:                Physical Exam:    VS:  BP 132/78   Pulse (!) 57   Ht 6' (1.829 m)   Wt 207 lb 4 oz (94 kg)   BMI 28.11 kg/m     Wt Readings from Last 3 Encounters:  08/28/22 207 lb 4 oz (94 kg)  08/21/22 209 lb (94.8 kg)  05/29/22 201 lb 12.8 oz (91.5 kg)     GEN:  Well nourished, overweight, in no acute distress HEENT: Normal NECK: No JVD; No carotid bruits LYMPHATICS: No lymphadenopathy CARDIAC: RRR, no murmurs, rubs, gallops RESPIRATORY:  Clear to auscultation without rales, wheezing or rhonchi  ABDOMEN: Soft, non-tender, non-distended MUSCULOSKELETAL:  No edema; No deformity  SKIN: Warm and dry NEUROLOGIC:  Alert and oriented x 3 PSYCHIATRIC:  Normal affect   ASSESSMENT:    1. Precordial pain   2. Primary hypertension    PLAN:    In order of problems listed above:  Chest pain and back pain with chest tightness. Also lack of energy. Ecg without acute change. Constant nature  of pain suggests this is more musculoskeletal. Did have mild aortic enlargement before. Normal BP would argue against dissection. Recommend evaluation with labs including CBC, CMET, lipids and Troponin. Will arrange for coronary CTA. Recommend he take a baby ASA daily for now HTN controlled on Hyzaar.            Medication Adjustments/Labs and Tests Ordered: Current medicines are reviewed at length with the patient today.  Concerns regarding medicines are outlined above.  Orders Placed This Encounter  Procedures   CT CORONARY MORPH W/CTA COR W/SCORE W/CA W/CM &/OR WO/CM   CBC w/Diff/Platelet   Comprehensive Metabolic Panel (CMET)  Lipid panel   Troponin T   Meds ordered this encounter  Medications   metoprolol tartrate (LOPRESSOR) 25 MG tablet    Sig: Take 25 mg  2 hours before Coronary CT    Dispense:  1 tablet    Refill:  0    Patient Instructions  Medication Instructions:   *If you need a refill on your cardiac medications before your next appointment, please call your pharmacy*   Lab Work:  If you have labs (blood work) drawn today and your tests are completely normal, you will receive your results only by: MyChart Message (if you have MyChart) OR A paper copy in the mail If you have any lab test that is abnormal or we need to change your treatment, we will call you to review the results.   Testing/Procedures:    Follow-Up: At [facility], you and your health needs are our priority.  As part of our continuing mission to provide you with exceptional heart care, we have created designated Provider Care Teams.  These Care Teams include your primary Cardiologist (physician) and Advanced Practice Providers (APPs -  Physician Assistants and Nurse Practitioners) who all work together to provide you with the care you need, when you need it.  We recommend signing up for the patient portal called "MyChart".  Sign up information is provided on this After Visit  Summary.  MyChart is used to connect with patients for Virtual Visits (Telemedicine).  Patients are able to view lab/test results, encounter notes, upcoming appointments, etc.  Non-urgent messages can be sent to your provider as well.   To learn more about what you can do with MyChart, go to [email or web address].    Your next appointment:   TBD  The format for your next appointment:     Provider:  Dr.Keyanah Kozicki   Important Information About Sugar         Signed, Froylan Hobby [place], MD  08/28/2022 1:22 PM    [facility]

## 2022-08-28 ENCOUNTER — Ambulatory Visit: Payer: 59 | Attending: Cardiology | Admitting: Cardiology

## 2022-08-28 ENCOUNTER — Other Ambulatory Visit (HOSPITAL_COMMUNITY): Payer: Self-pay

## 2022-08-28 ENCOUNTER — Encounter: Payer: Self-pay | Admitting: Cardiology

## 2022-08-28 VITALS — BP 132/78 | HR 57 | Ht 72.0 in | Wt 207.2 lb

## 2022-08-28 DIAGNOSIS — I10 Essential (primary) hypertension: Secondary | ICD-10-CM

## 2022-08-28 DIAGNOSIS — R072 Precordial pain: Secondary | ICD-10-CM | POA: Diagnosis not present

## 2022-08-28 MED ORDER — METOPROLOL TARTRATE 25 MG PO TABS
ORAL_TABLET | ORAL | 0 refills | Status: DC
  Filled 2022-08-28 – 2022-09-09 (×2): qty 1, 1d supply, fill #0

## 2022-08-28 NOTE — Patient Instructions (Addendum)
Medication Instructions:  Continue same medications *If you need a refill on your cardiac medications before your next appointment, please call your pharmacy*   Lab Work: Cmet,cbc,lipid panel,troponin today   Testing/Procedures: Coronary CT    will be scheduled after approved by insurance   Follow instructions below   Follow-Up: At [facility], you and your health needs are our priority.  As part of our continuing mission to provide you with exceptional heart care, we have created designated Provider Care Teams.  These Care Teams include your primary Cardiologist (physician) and Advanced Practice Providers (APPs -  Physician Assistants and Nurse Practitioners) who all work together to provide you with the care you need, when you need it.  We recommend signing up for the patient portal called "MyChart".  Sign up information is provided on this After Visit Summary.  MyChart is used to connect with patients for Virtual Visits (Telemedicine).  Patients are able to view lab/test results, encounter notes, upcoming appointments, etc.  Non-urgent messages can be sent to your provider as well.   To learn more about what you can do with MyChart, go to [email or web address].    Your next appointment:  To Be Determined after test    The format for your next appointment: Office   Provider:  Dr.Jordan     Your cardiac CT will be scheduled at one of the below locations:   [facility] [address] [place], [place] [address] [phone]  OR  [facility] [address] [place], [place] [address] [phone]  OR   [facility] [address] [place], [place] [address] [phone]  If scheduled at [facility], please arrive at the [facility] Entrance (Entrance C2) of [facility] 30 minutes prior to test start time. You can use the FREE valet parking offered  at entrance C (encouraged to control the heart rate for the test)  Proceed to the [facility] Radiology Department (first floor) to check-in and test prep.  All radiology patients and guests should use entrance C2 at [facility], accessed from [facility], even though the hospital's physical address listed is [address]    If scheduled at [facility] or [facility], please arrive 15 mins early for check-in and test prep.   Please follow these instructions carefully (unless otherwise directed):  Hold all erectile dysfunction medications at least 3 days (72 hrs) prior to test. (Ie viagra, cialis, sildenafil, tadalafil, etc) We will administer nitroglycerin during this exam.   On the Night Before the Test: Be sure to Drink plenty of water. Do not consume any caffeinated/decaffeinated beverages or chocolate 12 hours prior to your test. Do not take any antihistamines 12 hours prior to your test.   On the Day of the Test: Drink plenty of water until 1 hour prior to the test. Do not eat any food 1 hour prior to test. You may take your regular medications prior to the test.  Take metoprolol 25 mg two hours prior to test. HOLD Losartan/Hydrochlorothiazide morning of the test.         After the Test: Drink plenty of water. After receiving IV contrast, you may experience a mild flushed feeling. This is normal. On occasion, you may experience a mild rash up to 24 hours after the test. This is not dangerous. If this occurs, you can take Benadryl 25 mg and increase  your fluid intake. If you experience trouble breathing, this can be serious. If it is severe call 911 IMMEDIATELY. If it is mild, please call our office.  We will call to schedule your test 2-4 weeks out understanding that some insurance companies will need an authorization prior to the service being performed.   For non-scheduling related questions,  please contact the cardiac imaging nurse navigator should you have any questions/concerns: Rockwell Alexandria, Cardiac Imaging Nurse Navigator Larey Brick, Cardiac Imaging Nurse Navigator [facility] Direct Office Dial: [phone]   For scheduling needs, including cancellations and rescheduling, please call [place], [phone].    Important Information About Sugar

## 2022-08-28 NOTE — Addendum Note (Signed)
Addended by: Neoma Laming on: 08/28/2022 01:33 PM   Modules accepted: Orders

## 2022-08-29 LAB — COMPREHENSIVE METABOLIC PANEL
ALT: 29 IU/L (ref 0–44)
AST: 18 IU/L (ref 0–40)
Albumin/Globulin Ratio: 1.7 (ref 1.2–2.2)
Albumin: 4.3 g/dL (ref 3.9–4.9)
Alkaline Phosphatase: 78 IU/L (ref 44–121)
BUN/Creatinine Ratio: 19 (ref 10–24)
BUN: 17 mg/dL (ref 8–27)
Bilirubin Total: 0.6 mg/dL (ref 0.0–1.2)
CO2: 24 mmol/L (ref 20–29)
Calcium: 9.4 mg/dL (ref 8.6–10.2)
Chloride: 101 mmol/L (ref 96–106)
Creatinine, Ser: 0.88 mg/dL (ref 0.76–1.27)
Globulin, Total: 2.5 g/dL (ref 1.5–4.5)
Glucose: 122 mg/dL — ABNORMAL HIGH (ref 70–99)
Potassium: 4.2 mmol/L (ref 3.5–5.2)
Sodium: 140 mmol/L (ref 134–144)
Total Protein: 6.8 g/dL (ref 6.0–8.5)
eGFR: 97 mL/min/{1.73_m2} (ref >59)

## 2022-08-29 LAB — CBC WITH DIFFERENTIAL/PLATELET
Basophils Absolute: 0 10*3/uL (ref 0.0–0.2)
Basos: 1 %
EOS (ABSOLUTE): 0.1 10*3/uL (ref 0.0–0.4)
Eos: 2 %
Hematocrit: 46 % (ref 37.5–51.0)
Hemoglobin: 16.6 g/dL (ref 13.0–17.7)
Immature Grans (Abs): 0 10*3/uL (ref 0.0–0.1)
Immature Granulocytes: 0 %
Lymphocytes Absolute: 1.7 10*3/uL (ref 0.7–3.1)
Lymphs: 28 %
MCH: 33.9 pg — ABNORMAL HIGH (ref 26.6–33.0)
MCHC: 36.1 g/dL — ABNORMAL HIGH (ref 31.5–35.7)
MCV: 94 fL (ref 79–97)
Monocytes Absolute: 0.4 10*3/uL (ref 0.1–0.9)
Monocytes: 7 %
Neutrophils Absolute: 3.7 10*3/uL (ref 1.4–7.0)
Neutrophils: 62 %
Platelets: 175 10*3/uL (ref 150–450)
RBC: 4.9 x10E6/uL (ref 4.14–5.80)
RDW: 11.7 % (ref 11.6–15.4)
WBC: 5.9 10*3/uL (ref 3.4–10.8)

## 2022-08-29 LAB — LIPID PANEL
Chol/HDL Ratio: 5.4 ratio — ABNORMAL HIGH (ref 0.0–5.0)
Cholesterol, Total: 201 mg/dL — ABNORMAL HIGH (ref 100–199)
HDL: 37 mg/dL — ABNORMAL LOW (ref >39)
LDL Chol Calc (NIH): 104 mg/dL — ABNORMAL HIGH (ref 0–99)
Triglycerides: 354 mg/dL — ABNORMAL HIGH (ref 0–149)
VLDL Cholesterol Cal: 60 mg/dL — ABNORMAL HIGH (ref 5–40)

## 2022-08-29 LAB — TROPONIN T: Troponin T (Highly Sensitive): 7 ng/L (ref 0–22)

## 2022-08-31 ENCOUNTER — Other Ambulatory Visit (HOSPITAL_COMMUNITY): Payer: Self-pay

## 2022-09-07 ENCOUNTER — Other Ambulatory Visit (HOSPITAL_COMMUNITY): Payer: Self-pay

## 2022-09-09 ENCOUNTER — Telehealth (HOSPITAL_COMMUNITY): Payer: Self-pay | Admitting: *Deleted

## 2022-09-09 ENCOUNTER — Other Ambulatory Visit (HOSPITAL_COMMUNITY): Payer: Self-pay

## 2022-09-09 NOTE — Telephone Encounter (Signed)
Reaching out to patient to offer assistance regarding upcoming cardiac imaging study; pt verbalizes understanding of appt date/time, parking situation and where to check in, and verified current allergies; name and call back number provided for further questions should they arise  Faelyn Sigler RN Navigator Cardiac Imaging [facility] [phone] office [phone] cell  Patient aware to arrive at 8:30am. 

## 2022-09-11 ENCOUNTER — Ambulatory Visit (HOSPITAL_COMMUNITY)
Admission: RE | Admit: 2022-09-11 | Discharge: 2022-09-11 | Disposition: A | Discharge status: Home / Self Care | Payer: 59 | Source: Ambulatory Visit | Attending: Cardiology | Admitting: Cardiology

## 2022-09-11 DIAGNOSIS — R072 Precordial pain: Secondary | ICD-10-CM | POA: Diagnosis not present

## 2022-09-11 MED ORDER — IOHEXOL 350 MG/ML SOLN
100.0000 mL | Freq: Once | INTRAVENOUS | Status: AC | PRN
  Administered 2022-09-11: 100 mL via INTRAVENOUS

## 2022-09-11 MED ORDER — NITROGLYCERIN 0.4 MG SL SUBL
0.8000 mg | SUBLINGUAL_TABLET | Freq: Once | SUBLINGUAL | Status: AC
  Administered 2022-09-11: 0.8 mg via SUBLINGUAL

## 2022-09-11 MED ORDER — NITROGLYCERIN 0.4 MG SL SUBL
SUBLINGUAL_TABLET | SUBLINGUAL | Status: AC
  Filled 2022-09-11: qty 2

## 2022-09-23 ENCOUNTER — Encounter: Payer: Self-pay | Admitting: Family Medicine

## 2022-09-23 DIAGNOSIS — I7121 Aneurysm of the ascending aorta, without rupture: Secondary | ICD-10-CM | POA: Insufficient documentation

## 2022-10-14 ENCOUNTER — Other Ambulatory Visit (HOSPITAL_COMMUNITY): Payer: Self-pay

## 2022-10-14 ENCOUNTER — Other Ambulatory Visit: Payer: Self-pay | Admitting: Physician Assistant

## 2022-10-14 DIAGNOSIS — Z20828 Contact with and (suspected) exposure to other viral communicable diseases: Secondary | ICD-10-CM

## 2022-10-14 MED ORDER — OSELTAMIVIR PHOSPHATE 75 MG PO CAPS
75.0000 mg | ORAL_CAPSULE | Freq: Every day | ORAL | 0 refills | Status: DC
  Filled 2022-10-14: qty 10, 10d supply, fill #0

## 2022-10-14 NOTE — Progress Notes (Signed)
Exposure to flu with his wife. Sent preventative tamiflu.

## 2022-12-10 ENCOUNTER — Other Ambulatory Visit: Payer: Self-pay

## 2022-12-11 ENCOUNTER — Other Ambulatory Visit: Payer: Self-pay

## 2022-12-11 ENCOUNTER — Other Ambulatory Visit (HOSPITAL_COMMUNITY): Payer: Self-pay

## 2022-12-11 MED ORDER — LOSARTAN POTASSIUM-HCTZ 50-12.5 MG PO TABS
1.0000 | ORAL_TABLET | Freq: Every day | ORAL | 3 refills | Status: DC
  Filled 2022-12-11 (×2): qty 90, 90d supply, fill #0
  Filled 2023-03-07: qty 90, 90d supply, fill #1
  Filled 2023-06-23 – 2023-07-30 (×2): qty 90, 90d supply, fill #2

## 2023-03-08 ENCOUNTER — Other Ambulatory Visit (HOSPITAL_COMMUNITY): Payer: Self-pay

## 2023-04-28 ENCOUNTER — Telehealth: Payer: Self-pay

## 2023-04-28 ENCOUNTER — Ambulatory Visit (INDEPENDENT_AMBULATORY_CARE_PROVIDER_SITE_OTHER): Payer: Commercial Managed Care - PPO | Admitting: Physician Assistant

## 2023-04-28 ENCOUNTER — Encounter: Payer: Self-pay | Admitting: Physician Assistant

## 2023-04-28 ENCOUNTER — Other Ambulatory Visit (HOSPITAL_COMMUNITY): Payer: Self-pay

## 2023-04-28 VITALS — BP 120/71 | HR 57 | Ht 72.0 in | Wt 204.2 lb

## 2023-04-28 DIAGNOSIS — E291 Testicular hypofunction: Secondary | ICD-10-CM

## 2023-04-28 DIAGNOSIS — K649 Unspecified hemorrhoids: Secondary | ICD-10-CM | POA: Diagnosis not present

## 2023-04-28 DIAGNOSIS — R350 Frequency of micturition: Secondary | ICD-10-CM

## 2023-04-28 DIAGNOSIS — I7121 Aneurysm of the ascending aorta, without rupture: Secondary | ICD-10-CM

## 2023-04-28 DIAGNOSIS — Z125 Encounter for screening for malignant neoplasm of prostate: Secondary | ICD-10-CM

## 2023-04-28 DIAGNOSIS — M7741 Metatarsalgia, right foot: Secondary | ICD-10-CM

## 2023-04-28 DIAGNOSIS — M7742 Metatarsalgia, left foot: Secondary | ICD-10-CM

## 2023-04-28 DIAGNOSIS — Z0000 Encounter for general adult medical examination without abnormal findings: Secondary | ICD-10-CM | POA: Diagnosis not present

## 2023-04-28 DIAGNOSIS — E559 Vitamin D deficiency, unspecified: Secondary | ICD-10-CM | POA: Diagnosis not present

## 2023-04-28 DIAGNOSIS — G4733 Obstructive sleep apnea (adult) (pediatric): Secondary | ICD-10-CM

## 2023-04-28 DIAGNOSIS — M2550 Pain in unspecified joint: Secondary | ICD-10-CM | POA: Diagnosis not present

## 2023-04-28 DIAGNOSIS — Z131 Encounter for screening for diabetes mellitus: Secondary | ICD-10-CM

## 2023-04-28 DIAGNOSIS — K439 Ventral hernia without obstruction or gangrene: Secondary | ICD-10-CM

## 2023-04-28 DIAGNOSIS — Z13220 Encounter for screening for lipoid disorders: Secondary | ICD-10-CM | POA: Diagnosis not present

## 2023-04-28 DIAGNOSIS — N401 Enlarged prostate with lower urinary tract symptoms: Secondary | ICD-10-CM | POA: Diagnosis not present

## 2023-04-28 DIAGNOSIS — K30 Functional dyspepsia: Secondary | ICD-10-CM | POA: Diagnosis not present

## 2023-04-28 MED ORDER — OMEPRAZOLE 40 MG PO CPDR
40.0000 mg | DELAYED_RELEASE_CAPSULE | Freq: Every day | ORAL | 3 refills | Status: AC
Start: 2023-04-28 — End: ?
  Filled 2023-04-28: qty 30, 30d supply, fill #0

## 2023-04-28 MED ORDER — HYDROCORTISONE (PERIANAL) 2.5 % EX CREA
1.0000 | TOPICAL_CREAM | Freq: Two times a day (BID) | CUTANEOUS | 1 refills | Status: AC
Start: 2023-04-28 — End: ?
  Filled 2023-04-28 – 2023-05-06 (×2): qty 30, 10d supply, fill #0

## 2023-04-28 NOTE — Progress Notes (Signed)
Complete physical exam  Patient: Juan Holt   DOB: 1961/05/20   62 y.o. Male  MRN: [id number]  Subjective:    Chief Complaint  Patient presents with   Annual Exam    Patient  requesting full panel blood work today.  He states he has upcoming appt to evaluate the left ascending aorta in Oct 2024. that was 4mm when viewed previously - he is concerned as he was told if it got to 6mm he would need surgery - he has been having increased indigestion and nausea.     Juan Holt is a 62 y.o. male who presents today for a complete physical exam. He reports consuming a general diet. The patient has a physically strenuous job, but has no regular exercise apart from work.  He generally feels poorly. He reports sleeping well. He does have additional problems to discuss today.   He is having more indigestion and nausea for the last few weeks. No stool changes. He is very stressed with work and life.   He has multiple joints that ache. He does not like to take medication. Both balls of his feet ache. He wears good supportive shoes.   He has ongoing problems with hemorrhoids. Not using anything to treat them.    Most recent fall risk assessment:    04/28/2023    8:39 AM  Fall Risk   Falls in the past year? 0  Number falls in past yr: 0  Injury with Fall? 0  Risk for fall due to : No Fall Risks  Follow up Falls evaluation completed     Most recent depression screenings:    04/28/2023    8:39 AM 04/29/2021    9:36 AM  PHQ 2/9 Scores  PHQ - 2 Score 0 0  PHQ- 9 Score  0    Vision:Within last year, Dental: No current dental problems and Receives regular dental care, and PSA: Agrees to PSA testing  Patient Active Problem List   Diagnosis Date Noted   Hemorrhoids 04/28/2023   Metatarsalgia of both feet 04/28/2023   Arthralgia of multiple joints 04/28/2023   Indigestion 04/28/2023   Ventral hernia without obstruction or gangrene 04/28/2023   Ascending aortic aneurysm (HCC) 09/23/2022    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/21/2022   Low testosterone in male 04/29/2021   Gastroesophageal reflux disease with esophagitis without hemorrhage 04/29/2021   Hypertriglyceridemia 04/24/2020   Vitamin D insufficiency 04/24/2020   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   Benign prostatic hyperplasia with urinary frequency 04/24/2020   Chronic fatigue 04/24/2020   Left hip pain 04/24/2020   Acute meniscal tear of left knee 04/24/2020   OSA (obstructive sleep apnea) 04/23/2020   Corn of toe 01/08/2020   Hand injury, left, initial encounter 12/19/2019   Right wrist injury 11/16/2019   Muscle cramps 10/30/2019   Reactive hypoglycemia 10/30/2019   Hyperlipidemia LDL goal <100 10/30/2019   Bilateral thumb pain 10/30/2019   Past Medical History:  Diagnosis Date   Hypoglycemia    Past Surgical History:  Procedure Laterality Date   VASECTOMY     Family History  Problem Relation Age of Onset   Cancer Father        small cell   Allergies  Allergen Reactions   Prednisone Other (See Comments)    Severe altered mental status      Patient Care Team: Nolene Ebbs as PCP - General (Family Medicine) Lyn Records, MD (  Inactive) as PCP - Cardiology (Cardiology)   Outpatient Medications Prior to Visit  Medication Sig   diclofenac Sodium (VOLTAREN) 1 % GEL Apply 4 g topically 4 (four) times daily. To affected joint.   losartan-hydrochlorothiazide (HYZAAR) 50-12.5 MG tablet Take 1 tablet by mouth daily.   [DISCONTINUED] diclofenac (VOLTAREN) 75 MG EC tablet Take 1 tablet (75 mg total) by mouth 2 (two) times daily.   [DISCONTINUED] metoprolol tartrate (LOPRESSOR) 25 MG tablet Take 1 tablet ( 25 mg) by mouth  2 hours before Coronary CT   [DISCONTINUED] oseltamivir (TAMIFLU) 75 MG capsule Take 1 capsule (75 mg total) by mouth daily.   No facility-administered medications prior to visit.    ROS  See HPI       Objective:     BP 120/71   Pulse (!) 57   Ht 6' (1.829 m)   Wt 204 lb 4 oz (92.6 kg)   SpO2 97%   BMI 27.70 kg/m  BP Readings from Last 3 Encounters:  04/28/23 120/71  09/11/22 (!) 159/90  08/28/22 132/78   Wt Readings from Last 3 Encounters:  04/28/23 204 lb 4 oz (92.6 kg)  08/28/22 207 lb 4 oz (94 kg)  08/21/22 209 lb (94.8 kg)   ..    04/28/2023    8:39 AM 04/29/2021    9:36 AM 04/23/2020    8:56 AM 10/27/2019    3:55 PM  Depression screen PHQ 2/9  Decreased Interest 0 0 0 0  Down, Depressed, Hopeless 0 0 0 0  PHQ - 2 Score 0 0 0 0  Altered sleeping  0 0 0  Tired, decreased energy  0 3 0  Change in appetite  0 0 0  Feeling bad or failure about yourself   0 0 0  Trouble concentrating  0 0 0  Moving slowly or fidgety/restless  0 0 0  Suicidal thoughts  0 0 0  PHQ-9 Score  0 3 0  Difficult doing work/chores  Not difficult at all Somewhat difficult Not difficult at all   ..    04/29/2021    9:37 AM 04/23/2020    8:57 AM 10/27/2019    3:55 PM  GAD 7 : Generalized Anxiety Score  Nervous, Anxious, on Edge 0 0 1  Control/stop worrying 0 1 1  Worry too much - different things 2 1 1   Trouble relaxing 2 2 3   Restless 2 2 3   Easily annoyed or irritable 2 3 2   Afraid - awful might happen 0 0 0  Total GAD 7 Score 8 9 11   Anxiety Difficulty Somewhat difficult Somewhat difficult Somewhat difficult    .Marland Kitchen  RO-AUA SYMPTOM     Row Name 04/28/23 0900         During the last Month   Sensation of Bladder not Empty Not at all     Urinate<2 hours after last About half the time     Mult. stop/start when voiding Not at all     Difficult to postpone voiding More than half the time     Weak urinary stream Less than half the time     Push/strain to begin urination Not at all     Times per night up to urinate Less than 1 time in 5       OTHER   Total Score 10                Physical Exam  BP 120/71   Pulse Marland Kitchen)  57   Ht 6' (1.829 m)   Wt 204 lb 4 oz (92.6 kg)   SpO2  97%   BMI 27.70 kg/m   General Appearance:    Alert, cooperative, no distress, appears stated age  Head:    Normocephalic, without obvious abnormality, atraumatic  Eyes:    PERRL, conjunctiva/corneas clear, EOM's intact, fundi    benign, both eyes       Ears:    Normal TM's and external ear canals, both ears  Nose:   Nares normal, septum midline, mucosa normal, no drainage    or sinus tenderness  Throat:   Lips, mucosa, and tongue normal; teeth and gums normal  Neck:   Supple, symmetrical, trachea midline, no adenopathy;       thyroid:  No enlargement/tenderness/nodules; no carotid   bruit or JVD  Back:     Symmetric, no curvature, ROM normal, no CVA tenderness  Lungs:     Clear to auscultation bilaterally, respirations unlabored  Chest wall:    No tenderness or deformity  Heart:    Regular rate and rhythm, S1 and S2 normal, no murmur, rub   or gallop  Abdomen:     Soft, non-tender, bowel sounds active all four quadrants,    Ventral hernia        Extremities:   Extremities normal, atraumatic, no cyanosis or edema  Pulses:   2+ and symmetric all extremities  Skin:   Skin color, texture, turgor normal, no rashes or lesions  Lymph nodes:   Cervical, supraclavicular, and axillary nodes normal  Neurologic:   CNII-XII intact. Normal strength, sensation and reflexes      throughout      Assessment & Plan:    Routine Health Maintenance and Physical Exam  Immunization History  Administered Date(s) Administered   Tdap 10/19/2016    Health Maintenance  Topic Date Due   COVID-19 Vaccine (1) 05/14/2023 (Originally 05/03/1961)   Zoster Vaccines- Shingrix (1 of 2) 07/29/2023 (Originally 11/03/2009)   INFLUENZA VACCINE  05/20/2023   Colonoscopy  10/26/2025   DTaP/Tdap/Td (2 - Td or Tdap) 10/19/2026   Hepatitis C Screening  Completed   HIV Screening  Completed   HPV VACCINES  Aged Out    Discussed health benefits of physical activity, and encouraged him to engage in regular exercise  appropriate for his age and condition.  Marland KitchenRoyston Sinner was seen today for annual exam.  Diagnoses and all orders for this visit:  Routine physical examination -     PSA -     TSH -     CBC with Differential/Platelet -     VITAMIN D 25 Hydroxy (Vit-D Deficiency, Fractures) -     Lipid panel -     CMP and Liver -     Testosterone  Aneurysm of ascending aorta without rupture (HCC)  OSA (obstructive sleep apnea)  Primary male hypogonadism -     Testosterone  Screening for lipid disorders -     Lipid panel  Screening for diabetes mellitus -     CMP and Liver  Vitamin D insufficiency -     VITAMIN D 25 Hydroxy (Vit-D Deficiency, Fractures)  Prostate cancer screening -     PSA  Hemorrhoids, unspecified hemorrhoid type -     hydrocortisone (ANUSOL-HC) 2.5 % rectal cream; Place 1 Application rectally 2 (two) times daily.  Metatarsalgia of both feet  Arthralgia of multiple joints  Indigestion  Benign prostatic hyperplasia with urinary frequency  Ventral hernia without obstruction  or gangrene   .Marland Kitchen Discussed 150 minutes of exercise a week.  Encouraged vitamin D 1000 units and Calcium 1300mg  or 4 servings of dairy a day.  PHQ no concerns GAD is elevated, declines any medication  Fasting labs ordered today Colonoscopy UTD Continue use of CPAP Declined shingles today but will consider in the future BP looks great Continue to follow up with cardiology regarding aneurysm management in october Start omeprazole for indigestion if no improvement let me know Anusol for hemorrhoids, discussed prevention(HO given) AUQ elevated, declined medication, will check PSA.  Tumeric for athralgia Consider metatarsal foot pads for pain Follow up as needed  Return in about 1 year (around 04/27/2024), or if symptoms worsen or fail to improve.     Tandy Gaw, PA-C

## 2023-04-28 NOTE — Telephone Encounter (Signed)
Spoke with patient informed that Omeprazole had been sent to pharmacy to address indigestion - requesting that patient take for 4 weeks in a.m.  Also informed that testing for left ascending aorta aneurysm  was done  08/2022 by cardiology and can not be done again before this time. He can address this at his upcoming appt with cardiology ( he thinks this is scheduled for October 2024.)   Patient was also informed that if the Omeprazole did not seem to help symptoms to call back to let us know and we would see if we could get the testing done sooner than November 2024.

## 2023-04-28 NOTE — Patient Instructions (Addendum)
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 These are called internal hemorrhoids. Around the opening of the butt (anus). These are called external hemorrhoids. Most hemorrhoids do not cause very bad problems. They often get better with changes to your lifestyle and what you eat. What are the causes? Having trouble pooping (constipation) or watery poop (diarrhea). Pushing too hard when you poop. Pregnancy. Being very overweight (obese). Sitting for too long. Riding a bike for a long time. Heavy lifting or other things that take a lot of effort. Anal sex. What are the signs or symptoms? Pain. Itching or soreness in the butt. Bleeding from the butt. Leaking poop. Swelling. One or more lumps around the opening of your butt. How is this treated? In most cases, hemorrhoids can be treated at home. You may be told to: Change what you eat. Make changes to your lifestyle. If these treatments do not help, you may need to have a procedure done. Your doctor may need to: Place rubber bands at the bottom of the hemorrhoids to make them fall off. Put medicine into the hemorrhoids to shrink them. Shine a type of light on the hemorrhoids to cause them to fall off. Do surgery to get rid of the hemorrhoids. Follow these instructions at home: Medicines Take over-the-counter and prescription medicines only as told by your doctor. Use creams with medicine in them or medicines that you put in your butt as told by your doctor. Eating and drinking  Eat foods that have a lot of fiber in them. These include whole grains, beans, nuts, fruits, and vegetables. Ask your doctor about taking products that have fiber added to them (fibersupplements). Take in less fat. You can do this  by: Eating low-fat dairy products. Eating less red meat. Staying away from processed foods. Drink enough fluid to keep your pee (urine) pale yellow. Managing pain and swelling  Take a warm-water bath (sitz bath) for 20 minutes to ease pain. Do this 3-4 times a day. You may do this in a bathtub. You may also use a portable sitz bath that fits over the toilet. If told, put ice on the painful area. It may help to use ice between your warm baths. Put ice in a plastic bag. Place a towel between your skin and the bag. Leave the ice on for 20 minutes, 2-3 times a day. If your skin turns bright red, take off the ice right away to prevent skin damage. The risk of damage is higher if you cannot feel pain, heat, or cold. General instructions Exercise. Ask your doctor how much and what kind of exercise is best for you. Go to the bathroom when you need to poop. Do not wait. Try not to push too hard when you poop. Keep your butt dry and clean. Use wet toilet paper or moist towelettes after you poop. Do not sit on the toilet for a long time. Contact a doctor if: You have pain and swelling that do not get better with treatment. You have trouble pooping. You cannot poop. You have pain or swelling outside the area of the hemorrhoids. Get help right away if: You have bleeding from the butt that will not stop. This information is not intended to replace advice given to you by your health care provider.  Make sure you discuss any questions you have with your health care provider. Document Revised: 06/17/2022 Document Reviewed: 06/17/2022 Elsevier Patient Education  2024 Elsevier Inc.     Health Maintenance, Male Adopting a healthy lifestyle and getting preventive care are important in promoting health and wellness. Ask your health care provider about: The right schedule for you to have regular tests and exams. Things you can do on your own to prevent diseases and keep yourself healthy. What should I  know about diet, weight, and exercise? Eat a healthy diet  Eat a diet that includes plenty of vegetables, fruits, low-fat dairy products, and lean protein. Do not eat a lot of foods that are high in solid fats, added sugars, or sodium. Maintain a healthy weight Body mass index (BMI) is a measurement that can be used to identify possible weight problems. It estimates body fat based on height and weight. Your health care provider can help determine your BMI and help you achieve or maintain a healthy weight. Get regular exercise Get regular exercise. This is one of the most important things you can do for your health. Most adults should: Exercise for at least 150 minutes each week. The exercise should increase your heart rate and make you sweat (moderate-intensity exercise). Do strengthening exercises at least twice a week. This is in addition to the moderate-intensity exercise. Spend less time sitting. Even light physical activity can be beneficial. Watch cholesterol and blood lipids Have your blood tested for lipids and cholesterol at 63 years of age, then have this test every 5 years. You may need to have your cholesterol levels checked more often if: Your lipid or cholesterol levels are high. You are older than 63 years of age. You are at high risk for heart disease. What should I know about cancer screening? Many types of cancers can be detected early and may often be prevented. Depending on your health history and family history, you may need to have cancer screening at various ages. This may include screening for: Colorectal cancer. Prostate cancer. Skin cancer. Lung cancer. What should I know about heart disease, diabetes, and high blood pressure? Blood pressure and heart disease High blood pressure causes heart disease and increases the risk of stroke. This is more likely to develop in people who have high blood pressure readings or are overweight. Talk with your health care provider  about your target blood pressure readings. Have your blood pressure checked: Every 3-5 years if you are 16-19 years of age. Every year if you are 70 years old or older. If you are between the ages of 41 and 76 and are a current or former smoker, ask your health care provider if you should have a one-time screening for abdominal aortic aneurysm (AAA). Diabetes Have regular diabetes screenings. This checks your fasting blood sugar level. Have the screening done: Once every three years after age 37 if you are at a normal weight and have a low risk for diabetes. More often and at a younger age if you are overweight or have a high risk for diabetes. What should I know about preventing infection? Hepatitis B If you have a higher risk for hepatitis B, you should be screened for this virus. Talk with your health care provider to find out if you are at risk for hepatitis B infection. Hepatitis C Blood testing is recommended for: Everyone born from [birth year] through 1966. Anyone with known risk factors for hepatitis C. Sexually transmitted infections (STIs) You should be screened  each year for STIs, including gonorrhea and chlamydia, if: You are sexually active and are younger than 63 years of age. You are older than 63 years of age and your health care provider tells you that you are at risk for this type of infection. Your sexual activity has changed since you were last screened, and you are at increased risk for chlamydia or gonorrhea. Ask your health care provider if you are at risk. Ask your health care provider about whether you are at high risk for HIV. Your health care provider may recommend a prescription medicine to help prevent HIV infection. If you choose to take medicine to prevent HIV, you should first get tested for HIV. You should then be tested every 3 months for as long as you are taking the medicine. Follow these instructions at home: Alcohol use Do not drink alcohol if your health care  provider tells you not to drink. If you drink alcohol: Limit how much you have to 0-2 drinks a day. Know how much alcohol is in your drink. In the [place] one drink equals one 12 oz bottle of beer (355 mL), one 5 oz glass of wine (148 mL), or one 1 oz glass of hard liquor (44 mL). Lifestyle Do not use any products that contain nicotine or tobacco. These products include cigarettes, chewing tobacco, and vaping devices, such as e-cigarettes. If you need help quitting, ask your health care provider. Do not use street drugs. Do not share needles. Ask your health care provider for help if you need support or information about quitting drugs. General instructions Schedule regular health, dental, and eye exams. Stay current with your vaccines. Tell your health care provider if: You often feel depressed. You have ever been abused or do not feel safe at home. Summary Adopting a healthy lifestyle and getting preventive care are important in promoting health and wellness. Follow your health care provider's instructions about healthy diet, exercising, and getting tested or screened for diseases. Follow your health care provider's instructions on monitoring your cholesterol and blood pressure. This information is not intended to replace advice given to you by your health care provider. Make sure you discuss any questions you have with your health care provider. Document Revised: 02/24/2021 Document Reviewed: 02/24/2021 Elsevier Patient Education  2024 [employer].

## 2023-04-29 LAB — VITAMIN D 25 HYDROXY (VIT D DEFICIENCY, FRACTURES): Vit D, 25-Hydroxy: 33.5 ng/mL (ref 30.0–100.0)

## 2023-04-29 LAB — LIPID PANEL
Chol/HDL Ratio: 4.7 ratio (ref 0.0–5.0)
Cholesterol, Total: 204 mg/dL — ABNORMAL HIGH (ref 100–199)
HDL: 43 mg/dL (ref >39)
LDL Chol Calc (NIH): 131 mg/dL — ABNORMAL HIGH (ref 0–99)
Triglycerides: 165 mg/dL — ABNORMAL HIGH (ref 0–149)
VLDL Cholesterol Cal: 30 mg/dL (ref 5–40)

## 2023-04-29 LAB — TESTOSTERONE: Testosterone: 131 ng/dL — ABNORMAL LOW (ref 264–916)

## 2023-04-29 LAB — CMP AND LIVER
ALT: 26 IU/L (ref 0–44)
AST: 22 IU/L (ref 0–40)
Albumin: 4.5 g/dL (ref 3.9–4.9)
Alkaline Phosphatase: 79 IU/L (ref 44–121)
BUN: 22 mg/dL (ref 8–27)
Bilirubin Total: 0.7 mg/dL (ref 0.0–1.2)
Bilirubin, Direct: 0.18 mg/dL (ref 0.00–0.40)
CO2: 23 mmol/L (ref 20–29)
Calcium: 9.5 mg/dL (ref 8.6–10.2)
Chloride: 104 mmol/L (ref 96–106)
Creatinine, Ser: 0.92 mg/dL (ref 0.76–1.27)
Glucose: 99 mg/dL (ref 70–99)
Potassium: 4.3 mmol/L (ref 3.5–5.2)
Sodium: 141 mmol/L (ref 134–144)
Total Protein: 7.1 g/dL (ref 6.0–8.5)
eGFR: 93 mL/min/{1.73_m2} (ref >59)

## 2023-04-29 LAB — CBC WITH DIFFERENTIAL/PLATELET
Basophils Absolute: 0 10*3/uL (ref 0.0–0.2)
Basos: 1 %
EOS (ABSOLUTE): 0.2 10*3/uL (ref 0.0–0.4)
Eos: 4 %
Hematocrit: 46.7 % (ref 37.5–51.0)
Hemoglobin: 16.2 g/dL (ref 13.0–17.7)
Immature Grans (Abs): 0 10*3/uL (ref 0.0–0.1)
Immature Granulocytes: 0 %
Lymphocytes Absolute: 1.8 10*3/uL (ref 0.7–3.1)
Lymphs: 36 %
MCH: 33 pg (ref 26.6–33.0)
MCHC: 34.7 g/dL (ref 31.5–35.7)
MCV: 95 fL (ref 79–97)
Monocytes Absolute: 0.5 10*3/uL (ref 0.1–0.9)
Monocytes: 10 %
Neutrophils Absolute: 2.5 10*3/uL (ref 1.4–7.0)
Neutrophils: 49 %
Platelets: 149 10*3/uL — ABNORMAL LOW (ref 150–450)
RBC: 4.91 x10E6/uL (ref 4.14–5.80)
RDW: 12.3 % (ref 11.6–15.4)
WBC: 5 10*3/uL (ref 3.4–10.8)

## 2023-04-29 LAB — PSA: Prostate Specific Ag, Serum: 0.4 ng/mL (ref 0.0–4.0)

## 2023-04-29 LAB — TSH: TSH: 1.09 u[IU]/mL (ref 0.450–4.500)

## 2023-04-29 NOTE — Progress Notes (Signed)
Juan Holt,  PSA is normal and low.  Thyroid looks good.  Vitamin D normal range.  Kidney, liver, glucose looks good.  Testosterone remains low. Supplementing could help you feel a little better. Thoughts?  TG better, LDL worsened, HDL improved.   Your 10 year risk is 12.7 percent. Anything over 7.5 percent we do suggest a statin to help decrease risk. Thoughts?   Marland KitchenMarland KitchenThe 10-year ASCVD risk score (Arnett DK, et al., 2019) is: 12.7%   Values used to calculate the score:     Age: 9 years     Sex: Male     Is Non-Hispanic African American: No     Diabetic: No     Tobacco smoker: No     Systolic Blood Pressure: 120 mmHg     Is BP treated: Yes     HDL Cholesterol: 43 mg/dL     Total Cholesterol: 204 mg/dL

## 2023-05-06 ENCOUNTER — Other Ambulatory Visit (HOSPITAL_COMMUNITY): Payer: Self-pay

## 2023-05-26 ENCOUNTER — Encounter: Payer: Self-pay | Admitting: Physician Assistant

## 2023-06-23 ENCOUNTER — Other Ambulatory Visit (HOSPITAL_COMMUNITY): Payer: Self-pay

## 2023-07-05 ENCOUNTER — Other Ambulatory Visit (HOSPITAL_COMMUNITY): Payer: Self-pay

## 2023-07-19 ENCOUNTER — Ambulatory Visit: Payer: Commercial Managed Care - PPO | Admitting: Podiatry

## 2023-07-19 DIAGNOSIS — L600 Ingrowing nail: Secondary | ICD-10-CM

## 2023-07-19 DIAGNOSIS — Q828 Other specified congenital malformations of skin: Secondary | ICD-10-CM | POA: Diagnosis not present

## 2023-07-19 NOTE — Progress Notes (Signed)
Subjective:  Patient ID: Juan Holt, male    DOB: Jul 27, 1961,  MRN: [id number]  Chief Complaint  Patient presents with   Callouses    Corn to left 5th toe.    Ingrown Toenail    Ingrown to left hallux medial border.     62 y.o. male presents with concern for corn on the left fifth toe on the medial aspect.  He also has had some intermittent pain with the left great toe medial border concern for possible ingrown.  He states that the area is not hurting him at this time and he usually takes out the ingrown nail border himself.  Had an injury to his right ankle few weeks ago where he jammed his ankle into a tool bench and heard on the side of the ankle but has been improving since then  Past Medical History:  Diagnosis Date   Hypoglycemia     Allergies  Allergen Reactions   Prednisone Other (See Comments)    Severe altered mental status    ROS: Negative except as per HPI above  Objective:  General: AAO x3, NAD  Dermatological: No significant tenderness with palpation along the medial border of the left hallux.  At the medial aspect of the left fifth toe distal interphalangeal joint there is noted to be a hyperkeratotic lesion with central hyperkeratotic core consistent with porokeratosis interdigitally.  Pain on palpation of this lesion  Vascular:  Dorsalis Pedis artery and Posterior Tibial artery pedal pulses are 2/4 bilateral.  Capillary fill time < 3 sec to all digits.   Neruologic: Grossly intact via light touch bilateral. Protective threshold intact to all sites bilateral.   Musculoskeletal: No significant pain on palpation of the right ankle.  No significant edema of the right ankle  Gait: Unassisted, Nonantalgic.   No images are attached to the encounter.  Assessment:   1. Porokeratosis   2. Ingrown left greater toenail      Plan:  Patient was evaluated and treated and all questions answered.  # Porokeratosis of left fifth toe -All symptomatic hyperkeratoses x  1 were safely debrided with a sterile #15 blade to patient's level of comfort without incident. We discussed preventative and palliative care of these lesions including supportive and accommodative shoegear, padding, prefabricated and custom molded accommodative orthoses, use of a pumice stone and lotions/creams daily.  # Ingrown nail left hallux -Patient states that the area is not bothering him enough to warrant doing a procedure at this time though he will keep that in mind if it does flareup and causing pain we will proceed with medial border phenol and alcohol matrixectomy  No follow-ups on file.          Corinna Gab, DPM [facility] / [facility]

## 2023-08-23 ENCOUNTER — Ambulatory Visit: Payer: Commercial Managed Care - PPO | Attending: Cardiology | Admitting: Cardiology

## 2023-08-23 ENCOUNTER — Encounter: Payer: Self-pay | Admitting: Cardiology

## 2023-08-23 VITALS — BP 140/85 | HR 51 | Ht 72.0 in | Wt 206.0 lb

## 2023-08-23 DIAGNOSIS — R072 Precordial pain: Secondary | ICD-10-CM | POA: Diagnosis not present

## 2023-08-23 DIAGNOSIS — I10 Essential (primary) hypertension: Secondary | ICD-10-CM

## 2023-08-23 DIAGNOSIS — I77819 Aortic ectasia, unspecified site: Secondary | ICD-10-CM | POA: Diagnosis not present

## 2023-08-23 DIAGNOSIS — R030 Elevated blood-pressure reading, without diagnosis of hypertension: Secondary | ICD-10-CM

## 2023-08-23 DIAGNOSIS — E785 Hyperlipidemia, unspecified: Secondary | ICD-10-CM

## 2023-08-23 MED ORDER — ROSUVASTATIN CALCIUM 10 MG PO TABS: 10.0000 mg | ORAL_TABLET | Freq: Every day | ORAL | 3 refills | Status: DC

## 2023-08-23 MED ORDER — METOPROLOL TARTRATE 25 MG PO TABS: ORAL_TABLET | ORAL | 0 refills | Status: DC

## 2023-08-23 NOTE — Patient Instructions (Addendum)
Medication Instructions:  Crestor 10 mg daily  Continue all other medications   *If you need a refill on your cardiac medications before your next appointment, please call your pharmacy*   Lab Work: Bmet Today   Lipids, bmet, hepatic in 3 months fasting   If you have labs (blood work) drawn today and your tests are completely normal, you will receive your results only by: MyChart Message (if you have MyChart) OR A paper copy in the mail If you have any lab test that is abnormal or we need to change your treatment, we will call you to review the results.   Testing/Procedures: Coronary CT will be scheduled after approval with insurance  Follow instructions below    Follow-Up: At [facility], you and your health needs are our priority.  As part of our continuing mission to provide you with exceptional heart care, we have created designated Provider Care Teams.  These Care Teams include your primary Cardiologist (physician) and Advanced Practice Providers (APPs -  Physician Assistants and Nurse Practitioners) who all work together to provide you with the care you need, when you need it.  We recommend signing up for the patient portal called "MyChart".  Sign up information is provided on this After Visit Summary.  MyChart is used to connect with patients for Virtual Visits (Telemedicine).  Patients are able to view lab/test results, encounter notes, upcoming appointments, etc.  Non-urgent messages can be sent to your provider as well.   To learn more about what you can do with MyChart, go to [email or web address].    Your next appointment:   To be determined after test   Provider:   Dr. Swaziland      Your cardiac CT will be scheduled at one of the below locations:   [facility] [address] [place], [place] [address] [phone]  OR  [facility] [address] [place], [place] [address] [phone]  OR   [facility] [address] [place], [place] [address] [phone]  If scheduled at [facility], please arrive at the [facility] (Entrance C2) of [facility] 30 minutes prior to test start time. You can use the FREE valet parking offered at entrance C (encouraged to control the heart rate for the test)  Proceed to the [facility] Radiology Department (first floor) to check-in and test prep.  All radiology patients and guests should use entrance C2 at [facility], accessed from [facility], even though the hospital's physical address listed is [address]    If scheduled at [facility] or [facility], please arrive 15 mins early for check-in and test prep.  There is spacious parking and easy access to the radiology department from the [facility] entrance. Please enter here and check-in with the desk attendant.   Please follow these instructions carefully (unless otherwise directed):  An IV will be required for this test and Nitroglycerin will be given.  Hold all erectile dysfunction medications at least 3 days (72 hrs) prior to test. (Ie viagra, cialis, sildenafil, tadalafil, etc)   On the Night Before the Test: Be sure to Drink plenty of water. Do not consume any caffeinated/decaffeinated beverages or chocolate 12 hours prior to your test. Do not take any antihistamines 12 hours prior to your test.  On the Day of the Test: Drink plenty of water until  1 hour prior to the test. Do not eat any food 1 hour prior to test. You may take your regular medications prior to the test.  Take metoprolol tartrate 25 mg  two hours prior to test.  lorsartan/Hydrochlorothiazide/Spironolactone, please HOLD on the morning of the test.          After the Test: Drink plenty of water. After receiving IV contrast, you may experience  a mild flushed feeling. This is normal. On occasion, you may experience a mild rash up to 24 hours after the test. This is not dangerous. If this occurs, you can take Benadryl 25 mg and increase your fluid intake. If you experience trouble breathing, this can be serious. If it is severe call 911 IMMEDIATELY. If it is mild, please call our office.   We will call to schedule your test 2-4 weeks out understanding that some insurance companies will need an authorization prior to the service being performed.   For more information and frequently asked questions, please visit our website : [email or web address]  For non-scheduling related questions, please contact the cardiac imaging nurse navigator should you have any questions/concerns: Cardiac Imaging Nurse Navigators Direct Office Dial: [phone]   For scheduling needs, including cancellations and rescheduling, please call [place], [phone].

## 2023-08-24 LAB — BASIC METABOLIC PANEL
BUN/Creatinine Ratio: 22 (ref 10–24)
BUN: 18 mg/dL (ref 8–27)
CO2: 23 mmol/L (ref 20–29)
Calcium: 9.3 mg/dL (ref 8.6–10.2)
Chloride: 105 mmol/L (ref 96–106)
Creatinine, Ser: 0.82 mg/dL (ref 0.76–1.27)
Glucose: 91 mg/dL (ref 70–99)
Potassium: 4.6 mmol/L (ref 3.5–5.2)
Sodium: 140 mmol/L (ref 134–144)
eGFR: 99 mL/min/{1.73_m2} (ref >59)

## 2023-09-08 ENCOUNTER — Encounter: Payer: Self-pay | Admitting: Cardiology

## 2023-09-08 DIAGNOSIS — R072 Precordial pain: Secondary | ICD-10-CM

## 2023-09-08 DIAGNOSIS — G4733 Obstructive sleep apnea (adult) (pediatric): Secondary | ICD-10-CM

## 2023-09-08 DIAGNOSIS — R079 Chest pain, unspecified: Secondary | ICD-10-CM

## 2023-09-08 NOTE — Telephone Encounter (Addendum)
Called and spoke to patient who states he is feeling worse since his office visit on 11/4. He reports he is having constant chest pain, dizziness he has no energy. He also report his heart rate has been as low as 41. Blood pressure is 140/70. Patient report having chest pain and dizziness while talking to him. Patient was advised to go to the ED for evaluation and he refused. There are no appointment available in the office for the next. Please advise.

## 2023-09-09 NOTE — Telephone Encounter (Signed)
Spoke to pt regarding Dr. Elvis Coil recommendation for Split Night sleep study.  This is currently taking 4-6 weeks to get scheduled.  Pt in agreement.  Orders placed.

## 2023-09-10 ENCOUNTER — Telehealth (HOSPITAL_COMMUNITY): Payer: Self-pay | Admitting: *Deleted

## 2023-09-10 NOTE — Telephone Encounter (Signed)
Reaching out to patient to offer assistance regarding upcoming cardiac imaging study; pt verbalizes understanding of appt date/time, parking situation and where to check in, pre-test NPO status and verified current allergies; name and call back number provided for further questions should they arise  Larey Brick RN Navigator Cardiac Imaging [facility] [phone] office [phone] cell  Patient aware to arrive at 8am.

## 2023-09-13 ENCOUNTER — Ambulatory Visit (HOSPITAL_COMMUNITY): Admission: RE | Admit: 2023-09-13 | Payer: PRIVATE HEALTH INSURANCE | Source: Ambulatory Visit

## 2023-09-13 ENCOUNTER — Telehealth: Payer: Self-pay | Admitting: Cardiology

## 2023-09-13 DIAGNOSIS — E785 Hyperlipidemia, unspecified: Secondary | ICD-10-CM

## 2023-09-13 DIAGNOSIS — R079 Chest pain, unspecified: Secondary | ICD-10-CM

## 2023-09-13 DIAGNOSIS — I10 Essential (primary) hypertension: Secondary | ICD-10-CM

## 2023-09-13 DIAGNOSIS — I77819 Aortic ectasia, unspecified site: Secondary | ICD-10-CM

## 2023-09-13 DIAGNOSIS — R030 Elevated blood-pressure reading, without diagnosis of hypertension: Secondary | ICD-10-CM

## 2023-09-13 DIAGNOSIS — R072 Precordial pain: Secondary | ICD-10-CM

## 2023-09-13 NOTE — Telephone Encounter (Signed)
  Pt is requesting to fax the order for his CT Morph to [facility], he gave fax # [phone]

## 2023-09-13 NOTE — Telephone Encounter (Signed)
Noted  Fax sent per patient request  MyChart message sent to notify patient

## 2023-09-14 NOTE — Telephone Encounter (Signed)
Patient identification verified by 2 forms. Marilynn Rail, RN    Called and spoke to patient  Informed patient fax sent (RN received confirmation) Advised patient to contact [facility]  Patient has no further questions at this time

## 2023-09-14 NOTE — Telephone Encounter (Signed)
RN re ordered CT  Order requisition printed and signed by Dr. Swaziland  Fax sent to [phone] Awaiting to receive confirmation  Will update patient once confirmation received

## 2023-09-14 NOTE — Telephone Encounter (Signed)
Patient identification verified by 2 forms. Juan Rail, RN    Called and spoke to patient  Patient states:   -needs CT completed at [facility] due to insurance   -if completed at [facility], it will cost $1700  Informed patient RN will attempt to refax order

## 2023-09-17 ENCOUNTER — Ambulatory Visit (HOSPITAL_COMMUNITY): Payer: Commercial Managed Care - PPO

## 2023-10-19 ENCOUNTER — Other Ambulatory Visit (HOSPITAL_COMMUNITY): Payer: Self-pay

## 2024-02-02 ENCOUNTER — Encounter: Payer: Self-pay | Admitting: Cardiology

## 2024-02-11 ENCOUNTER — Other Ambulatory Visit: Payer: Self-pay

## 2024-02-11 NOTE — Telephone Encounter (Signed)
 Message sent to Dr.Jordan for advice Losartan  100/12.5 mg not available just 100/25 mg.

## 2024-02-14 ENCOUNTER — Other Ambulatory Visit (HOSPITAL_COMMUNITY): Payer: Self-pay

## 2024-02-14 MED ORDER — LOSARTAN POTASSIUM 100 MG PO TABS
100.0000 mg | ORAL_TABLET | Freq: Every day | ORAL | 6 refills | Status: DC
  Filled 2024-02-14: qty 30, 30d supply, fill #0

## 2024-02-14 MED ORDER — HYDROCHLOROTHIAZIDE 12.5 MG PO CAPS
12.5000 mg | ORAL_CAPSULE | Freq: Every day | ORAL | 6 refills | Status: DC
  Filled 2024-02-14: qty 30, 30d supply, fill #0

## 2024-02-14 NOTE — Telephone Encounter (Addendum)
 Called pt he states that he was called by the pharmacy x2 and was told to take 2 of current rx until gone. I will send 2 new rx losartan  100mg  and HCTZ 12.5mg  take 1 tab daily. Pt informed that we can fill for 90 days on 2nd rx. Pt notified of this fill.

## 2024-02-16 DIAGNOSIS — M5136 Other intervertebral disc degeneration, lumbar region with discogenic back pain only: Secondary | ICD-10-CM | POA: Diagnosis not present

## 2024-02-16 DIAGNOSIS — M9904 Segmental and somatic dysfunction of sacral region: Secondary | ICD-10-CM | POA: Diagnosis not present

## 2024-02-16 DIAGNOSIS — M5137 Other intervertebral disc degeneration, lumbosacral region with discogenic back pain only: Secondary | ICD-10-CM | POA: Diagnosis not present

## 2024-02-16 DIAGNOSIS — M9903 Segmental and somatic dysfunction of lumbar region: Secondary | ICD-10-CM | POA: Diagnosis not present

## 2024-02-16 DIAGNOSIS — M9905 Segmental and somatic dysfunction of pelvic region: Secondary | ICD-10-CM | POA: Diagnosis not present

## 2024-02-17 DIAGNOSIS — M5137 Other intervertebral disc degeneration, lumbosacral region with discogenic back pain only: Secondary | ICD-10-CM | POA: Diagnosis not present

## 2024-02-17 DIAGNOSIS — M9905 Segmental and somatic dysfunction of pelvic region: Secondary | ICD-10-CM | POA: Diagnosis not present

## 2024-02-17 DIAGNOSIS — M5136 Other intervertebral disc degeneration, lumbar region with discogenic back pain only: Secondary | ICD-10-CM | POA: Diagnosis not present

## 2024-02-17 DIAGNOSIS — M9903 Segmental and somatic dysfunction of lumbar region: Secondary | ICD-10-CM | POA: Diagnosis not present

## 2024-02-17 DIAGNOSIS — M9904 Segmental and somatic dysfunction of sacral region: Secondary | ICD-10-CM | POA: Diagnosis not present

## 2024-02-18 ENCOUNTER — Other Ambulatory Visit: Payer: Self-pay

## 2024-02-18 ENCOUNTER — Other Ambulatory Visit (HOSPITAL_COMMUNITY): Payer: Self-pay

## 2024-02-18 MED ORDER — LOSARTAN POTASSIUM-HCTZ 100-25 MG PO TABS
1.0000 | ORAL_TABLET | Freq: Every day | ORAL | 3 refills | Status: AC
  Filled 2024-02-18: qty 90, 90d supply, fill #0

## 2024-02-18 NOTE — Telephone Encounter (Signed)
 Spoke to patient Dr.Jordan advised ok to take Losartan  100/25 mg daily. 90 day prescription sent to pharmacy.

## 2024-03-01 ENCOUNTER — Other Ambulatory Visit (HOSPITAL_COMMUNITY): Payer: Self-pay

## 2024-03-08 ENCOUNTER — Other Ambulatory Visit (HOSPITAL_COMMUNITY): Payer: Self-pay

## 2024-03-08 ENCOUNTER — Encounter: Payer: Self-pay | Admitting: Pharmacist

## 2024-03-08 ENCOUNTER — Ambulatory Visit: Admitting: Physician Assistant

## 2024-03-08 ENCOUNTER — Ambulatory Visit (INDEPENDENT_AMBULATORY_CARE_PROVIDER_SITE_OTHER)

## 2024-03-08 VITALS — BP 130/81 | HR 54 | Ht 72.0 in | Wt 212.6 lb

## 2024-03-08 DIAGNOSIS — G8929 Other chronic pain: Secondary | ICD-10-CM | POA: Diagnosis not present

## 2024-03-08 DIAGNOSIS — E785 Hyperlipidemia, unspecified: Secondary | ICD-10-CM

## 2024-03-08 DIAGNOSIS — M545 Low back pain, unspecified: Secondary | ICD-10-CM

## 2024-03-08 DIAGNOSIS — M5136 Other intervertebral disc degeneration, lumbar region with discogenic back pain only: Secondary | ICD-10-CM

## 2024-03-08 DIAGNOSIS — M419 Scoliosis, unspecified: Secondary | ICD-10-CM | POA: Diagnosis not present

## 2024-03-08 DIAGNOSIS — M48061 Spinal stenosis, lumbar region without neurogenic claudication: Secondary | ICD-10-CM | POA: Diagnosis not present

## 2024-03-08 MED ORDER — ROSUVASTATIN CALCIUM 10 MG PO TABS
10.0000 mg | ORAL_TABLET | Freq: Every day | ORAL | 3 refills | Status: AC
  Filled 2024-03-08: qty 90, 90d supply, fill #0

## 2024-03-08 MED ORDER — ORPHENADRINE CITRATE ER 100 MG PO TB12
100.0000 mg | ORAL_TABLET | Freq: Two times a day (BID) | ORAL | 0 refills | Status: AC
  Filled 2024-03-08: qty 60, 30d supply, fill #0

## 2024-03-08 MED ORDER — MELOXICAM 15 MG PO TABS
15.0000 mg | ORAL_TABLET | Freq: Every day | ORAL | 1 refills | Status: DC
  Filled 2024-03-08: qty 30, 30d supply, fill #0

## 2024-03-08 NOTE — Patient Instructions (Signed)
 Start mobic  daily as needed Get xrays today Will order physical therapy
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
## 2024-03-09 ENCOUNTER — Other Ambulatory Visit (HOSPITAL_COMMUNITY): Payer: Self-pay

## 2024-03-10 ENCOUNTER — Encounter: Payer: Self-pay | Admitting: Physician Assistant

## 2024-03-17 ENCOUNTER — Ambulatory Visit: Payer: Self-pay | Admitting: Physician Assistant

## 2024-03-17 ENCOUNTER — Encounter: Payer: Self-pay | Admitting: Physician Assistant

## 2024-03-17 DIAGNOSIS — M51369 Other intervertebral disc degeneration, lumbar region without mention of lumbar back pain or lower extremity pain: Secondary | ICD-10-CM | POA: Insufficient documentation

## 2024-03-17 NOTE — Progress Notes (Signed)
 Degenerative Disc Disease with progression since 2020. Follow up with Dr. Elva Hamburger in about 2-3 weeks after trial of PT.

## 2024-03-22 NOTE — Therapy (Deleted)
 OUTPATIENT PHYSICAL THERAPY THORACOLUMBAR EVALUATION   Patient Name: Juan Holt MRN: [id number] DOB:1961/03/01, 63 y.o., male Today's Date: 03/22/2024  END OF SESSION:   Past Medical History:  Diagnosis Date   Hypoglycemia    Past Surgical History:  Procedure Laterality Date   VASECTOMY     Patient Active Problem List   Diagnosis Date Noted   DDD (degenerative disc disease), lumbar 03/17/2024   Hemorrhoids 04/28/2023   Metatarsalgia of both feet 04/28/2023   Arthralgia of multiple joints 04/28/2023   Indigestion 04/28/2023   Ventral hernia without obstruction or gangrene 04/28/2023   Ascending aortic aneurysm (HCC) 09/23/2022   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/21/2022   Low testosterone  in male 04/29/2021   Gastroesophageal reflux disease with esophagitis without hemorrhage 04/29/2021   Hypertriglyceridemia 04/24/2020   Vitamin D  insufficiency 04/24/2020   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   Benign prostatic hyperplasia with urinary frequency 04/24/2020   Chronic fatigue 04/24/2020   Left hip pain 04/24/2020   Acute meniscal tear of left knee 04/24/2020   OSA (obstructive sleep apnea) 04/23/2020   Corn of toe 01/08/2020   Hand injury, left, initial encounter 12/19/2019   Right wrist injury 11/16/2019   Muscle cramps 10/30/2019   Reactive hypoglycemia 10/30/2019   Hyperlipidemia LDL goal <100 10/30/2019   Bilateral thumb pain 10/30/2019    PCP: Araceli Knight, PA-C  REFERRING PROVIDER: Araceli Knight, PA-C  REFERRING DIAG: Chronic L sided LBP; DDD lumbar; discogenic back pain   Rationale for Evaluation and Treatment: Rehabilitation  THERAPY DIAG:  No diagnosis found.  ONSET DATE: ***  SUBJECTIVE:                                                                                                                                                                                           SUBJECTIVE  STATEMENT: ***  PERTINENT HISTORY:  ***  PAIN:  Are you having pain? Yes: NPRS scale: *** Pain location: *** Pain description: *** Aggravating factors: *** Relieving factors: ***  PRECAUTIONS: None  RED FLAGS: None   WEIGHT BEARING RESTRICTIONS: {Yes ***/No:24003}  FALLS:  Has patient fallen in last 6 months? {fallsyesno:27318}  LIVING ENVIRONMENT: Lives with: lives with their spouse Lives in: House/apartment Stairs: {opstairs:27293} Has following equipment at home: {Assistive devices:23999}  OCCUPATION: ***  PLOF: Independent  PATIENT GOALS: ***  NEXT MD VISIT: none scheduled   OBJECTIVE:  Note: Objective measures were completed at Evaluation unless otherwise noted.  DIAGNOSTIC FINDINGS:  Xray; 03/15/24: IMPRESSION: 1. Multilevel degenerative disc disease and facet hypertrophy, with progression from 2020. 2. Mild dextroscoliotic curvature of  the lumbar spine.  PATIENT SURVEYS:  Modified Oswestry ***   COGNITION: Overall cognitive status: Within functional limits for tasks assessed     SENSATION: {sensation:27233}  MUSCLE LENGTH: Hamstrings: Right *** deg; Left *** deg Andy Bannister test: Right *** deg; Left *** deg  POSTURE: {posture:25561}  PALPATION: ***  LUMBAR ROM:   AROM eval  Flexion   Extension   Right lateral flexion   Left lateral flexion   Right rotation   Left rotation    (Blank rows = not tested)  LOWER EXTREMITY ROM:     Active  Right eval Left eval  Hip flexion    Hip extension    Hip abduction    Hip adduction    Hip internal rotation    Hip external rotation    Knee flexion    Knee extension    Ankle dorsiflexion    Ankle plantarflexion    Ankle inversion    Ankle eversion     (Blank rows = not tested)  LOWER EXTREMITY MMT:    MMT Right eval Left eval  Hip flexion    Hip extension    Hip abduction    Hip adduction    Hip internal rotation    Hip external rotation    Knee flexion    Knee extension     Ankle dorsiflexion    Ankle plantarflexion    Ankle inversion    Ankle eversion     (Blank rows = not tested)  LUMBAR SPECIAL TESTS:  Straight leg raise test: {pos/neg:25243} and Slump test: {pos/neg:25243}  FUNCTIONAL TESTS:  5 times sit to stand: ***  GAIT: Distance walked: 40 feet Assistive device utilized: {Assistive devices:23999} Level of assistance: {Levels of assistance:24026} Comments: ***  TREATMENT DATE: ***                                                                                                                                 PATIENT EDUCATION:  Education details: POC; HEP  Person educated: Patient Education method: Programmer, multimedia, Facilities manager, Actor cues, Verbal cues, and Handouts Education comprehension: verbalized understanding, returned demonstration, verbal cues required, tactile cues required, and needs further education  HOME EXERCISE PROGRAM: ***  ASSESSMENT:  CLINICAL IMPRESSION: Patient is a 63 y.o. male who was seen today for physical therapy evaluation and treatment for chronic L sided LBP.   OBJECTIVE IMPAIRMENTS: {opptimpairments:25111}.   ACTIVITY LIMITATIONS: {activitylimitations:27494}  PARTICIPATION LIMITATIONS: {participationrestrictions:25113}  PERSONAL FACTORS: {Personal factors:25162} are also affecting patient's functional outcome.   REHAB POTENTIAL: Good  CLINICAL DECISION MAKING: Evolving/moderate complexity  EVALUATION COMPLEXITY: Moderate   GOALS: Goals reviewed with patient? Yes  SHORT TERM GOALS: Target date: ***  Independent in initial HEP  Baseline: Goal status: INITIAL  2.  *** Baseline:  Goal status: INITIAL  3.  *** Baseline:  Goal status: INITIAL   LONG TERM GOALS: Target date: ***  *** Baseline:  Goal status: INITIAL  2.  ***  Baseline:  Goal status: INITIAL  3.  *** Baseline:  Goal status: INITIAL  4.  *** Baseline:  Goal status: INITIAL  5.  *** Baseline:  Goal status:  INITIAL  6.  *** Baseline:  Goal status: INITIAL  PLAN:  PT FREQUENCY: 2x/week  PT DURATION: 8 weeks  PLANNED INTERVENTIONS: 97164- PT Re-evaluation, 97110-Therapeutic exercises, 97530- Therapeutic activity, [id number]- Neuromuscular re-education, 97535- Self Care, 40981- Manual therapy, [id number]- Aquatic Therapy, Patient/Family education, Taping, and Joint mobilization.  PLAN FOR NEXT SESSION: review and progress with exercise; continue back care and ergonomic education and home instruction; manual work and modalities as indicated    Brinton Brandel P Sirr Kabel, PT 03/22/2024, 4:33 PM

## 2024-03-27 ENCOUNTER — Ambulatory Visit: Attending: Physician Assistant | Admitting: Rehabilitative and Restorative Service Providers"

## 2024-03-31 ENCOUNTER — Encounter: Payer: Self-pay | Admitting: Cardiology

## 2024-04-05 ENCOUNTER — Other Ambulatory Visit (HOSPITAL_COMMUNITY): Payer: Self-pay

## 2024-04-05 ENCOUNTER — Telehealth: Payer: Self-pay

## 2024-04-05 MED ORDER — METOPROLOL TARTRATE 25 MG PO TABS
ORAL_TABLET | ORAL | 0 refills | Status: DC
  Filled 2024-04-05: qty 1, 1d supply, fill #0

## 2024-04-05 NOTE — Telephone Encounter (Signed)
 Spoke to patient he stated he never had Coronary CTA done last Nov.Stated he needs prescription for the Metoprolol  he is suppose to take 2 hours before ct.Refill sent to his pharmacy.Scheduler will call with appointment after approved by insurance.

## 2024-04-05 NOTE — Telephone Encounter (Signed)
 Spoke to patient see 6/18 telephone note.

## 2024-04-17 DIAGNOSIS — I10 Essential (primary) hypertension: Secondary | ICD-10-CM | POA: Diagnosis not present

## 2024-04-17 DIAGNOSIS — R030 Elevated blood-pressure reading, without diagnosis of hypertension: Secondary | ICD-10-CM | POA: Diagnosis not present

## 2024-04-17 DIAGNOSIS — R072 Precordial pain: Secondary | ICD-10-CM | POA: Diagnosis not present

## 2024-04-17 DIAGNOSIS — I77819 Aortic ectasia, unspecified site: Secondary | ICD-10-CM | POA: Diagnosis not present

## 2024-04-17 DIAGNOSIS — E785 Hyperlipidemia, unspecified: Secondary | ICD-10-CM | POA: Diagnosis not present

## 2024-04-18 ENCOUNTER — Other Ambulatory Visit: Payer: Self-pay

## 2024-04-18 DIAGNOSIS — R072 Precordial pain: Secondary | ICD-10-CM

## 2024-04-18 LAB — LIPID PANEL
Chol/HDL Ratio: 5.4 ratio — ABNORMAL HIGH (ref 0.0–5.0)
Cholesterol, Total: 185 mg/dL (ref 100–199)
HDL: 34 mg/dL — ABNORMAL LOW (ref >39)
LDL Chol Calc (NIH): 100 mg/dL — ABNORMAL HIGH (ref 0–99)
Triglycerides: 298 mg/dL — ABNORMAL HIGH (ref 0–149)
VLDL Cholesterol Cal: 51 mg/dL — ABNORMAL HIGH (ref 5–40)

## 2024-04-18 LAB — HEPATIC FUNCTION PANEL
ALT: 37 IU/L (ref 0–44)
AST: 25 IU/L (ref 0–40)
Albumin: 4 g/dL (ref 3.9–4.9)
Alkaline Phosphatase: 77 IU/L (ref 44–121)
Bilirubin Total: 0.5 mg/dL (ref 0.0–1.2)
Bilirubin, Direct: 0.16 mg/dL (ref 0.00–0.40)
Total Protein: 6.1 g/dL (ref 6.0–8.5)

## 2024-04-18 LAB — BASIC METABOLIC PANEL WITH GFR
BUN/Creatinine Ratio: 18 (ref 10–24)
BUN: 15 mg/dL (ref 8–27)
CO2: 19 mmol/L — ABNORMAL LOW (ref 20–29)
Calcium: 9.4 mg/dL (ref 8.6–10.2)
Chloride: 108 mmol/L — ABNORMAL HIGH (ref 96–106)
Creatinine, Ser: 0.85 mg/dL (ref 0.76–1.27)
Glucose: 99 mg/dL (ref 70–99)
Potassium: 4.6 mmol/L (ref 3.5–5.2)
Sodium: 144 mmol/L (ref 134–144)
eGFR: 97 mL/min/{1.73_m2} (ref >59)

## 2024-04-27 ENCOUNTER — Encounter (HOSPITAL_COMMUNITY): Payer: Self-pay | Admitting: Emergency Medicine

## 2024-04-27 ENCOUNTER — Telehealth (HOSPITAL_COMMUNITY): Payer: Self-pay | Admitting: Emergency Medicine

## 2024-04-27 NOTE — Telephone Encounter (Signed)
 Reaching out to patient to offer assistance regarding upcoming cardiac imaging study; pt verbalizes understanding of appt date/time, parking situation and where to check in, pre-test NPO status and medications ordered, and verified current allergies; name and call back number provided for further questions should they arise Rockwell Alexandria RN Navigator Cardiac Imaging [facility] [phone] office [phone] cell

## 2024-04-28 ENCOUNTER — Ambulatory Visit: Payer: Self-pay | Admitting: Cardiology

## 2024-04-28 ENCOUNTER — Ambulatory Visit (HOSPITAL_COMMUNITY)
Admission: RE | Admit: 2024-04-28 | Discharge: 2024-04-28 | Disposition: A | Discharge status: Home / Self Care | Source: Ambulatory Visit | Attending: Internal Medicine | Admitting: Internal Medicine

## 2024-04-28 DIAGNOSIS — I2510 Atherosclerotic heart disease of native coronary artery without angina pectoris: Secondary | ICD-10-CM | POA: Diagnosis not present

## 2024-04-28 DIAGNOSIS — I700 Atherosclerosis of aorta: Secondary | ICD-10-CM | POA: Diagnosis not present

## 2024-04-28 DIAGNOSIS — R072 Precordial pain: Secondary | ICD-10-CM | POA: Diagnosis present

## 2024-04-28 MED ORDER — IOHEXOL 350 MG/ML SOLN
100.0000 mL | Freq: Once | INTRAVENOUS | Status: AC | PRN
  Administered 2024-04-28: 100 mL via INTRAVENOUS

## 2024-04-28 MED ORDER — NITROGLYCERIN 0.4 MG SL SUBL
SUBLINGUAL_TABLET | SUBLINGUAL | Status: AC
Start: 2024-04-28 — End: 2024-04-28
  Filled 2024-04-28: qty 2

## 2024-04-28 MED ORDER — NITROGLYCERIN 0.4 MG SL SUBL
0.8000 mg | SUBLINGUAL_TABLET | Freq: Once | SUBLINGUAL | Status: AC
  Administered 2024-04-28: 0.8 mg via SUBLINGUAL

## 2024-04-28 NOTE — Progress Notes (Signed)
 Patient presented for a cardiac CT. At the end of the test, patient's IV infiltrated on his right arm in the [facility] area.  Per Ozell, the CT tech, there was a large amount of contrast in the study. Therefore the liquid in the patient's arm is likely saline. The patient's arm had a small area of swelling.  Pt reports some pressure but has at +2 radial pulse distal to the injection site. He also has full range of motion.  Dr. Pietro was notified and is agreeable for patient to go home. Instructions given to patient about care and follow up and when to seek medical care. He verbalized understanding. Pressure dressing applied and cold compress given to patient.

## 2024-04-29 ENCOUNTER — Telehealth (HOSPITAL_COMMUNITY): Payer: Self-pay | Admitting: *Deleted

## 2024-04-29 NOTE — Telephone Encounter (Signed)
 Patient returning call. He states his arm is still a little swollen but is getting better. I reassured him that this was normal and he is aware to call back if there are any other concerns regarding his arm.

## 2024-04-29 NOTE — Telephone Encounter (Signed)
 Attempted to call patient to follow up on his arm after his recent cardiac CT scan. Left message on voicemail with name and callback number  Chantal Requena RN Navigator Cardiac Imaging [facility] [phone] Office [phone] Cell

## 2024-05-02 ENCOUNTER — Encounter: Payer: Self-pay | Admitting: Cardiology

## 2024-06-06 ENCOUNTER — Ambulatory Visit: Admitting: Physician Assistant

## 2024-06-06 ENCOUNTER — Other Ambulatory Visit (HOSPITAL_COMMUNITY): Payer: Self-pay

## 2024-06-06 VITALS — BP 139/75 | HR 59 | Ht 72.0 in | Wt 212.0 lb

## 2024-06-06 DIAGNOSIS — G4733 Obstructive sleep apnea (adult) (pediatric): Secondary | ICD-10-CM

## 2024-06-06 DIAGNOSIS — Z131 Encounter for screening for diabetes mellitus: Secondary | ICD-10-CM | POA: Diagnosis not present

## 2024-06-06 DIAGNOSIS — G8929 Other chronic pain: Secondary | ICD-10-CM

## 2024-06-06 DIAGNOSIS — E559 Vitamin D deficiency, unspecified: Secondary | ICD-10-CM | POA: Diagnosis not present

## 2024-06-06 DIAGNOSIS — E781 Pure hyperglyceridemia: Secondary | ICD-10-CM | POA: Diagnosis not present

## 2024-06-06 DIAGNOSIS — M545 Low back pain, unspecified: Secondary | ICD-10-CM

## 2024-06-06 DIAGNOSIS — N529 Male erectile dysfunction, unspecified: Secondary | ICD-10-CM | POA: Diagnosis not present

## 2024-06-06 DIAGNOSIS — E785 Hyperlipidemia, unspecified: Secondary | ICD-10-CM | POA: Diagnosis not present

## 2024-06-06 DIAGNOSIS — Z0000 Encounter for general adult medical examination without abnormal findings: Secondary | ICD-10-CM

## 2024-06-06 MED ORDER — SILDENAFIL CITRATE 100 MG PO TABS
100.0000 mg | ORAL_TABLET | ORAL | 11 refills | Status: AC | PRN
  Filled 2024-06-06: qty 6, 30d supply, fill #0

## 2024-06-06 MED ORDER — MELOXICAM 15 MG PO TABS
15.0000 mg | ORAL_TABLET | Freq: Every day | ORAL | 1 refills | Status: AC
  Filled 2024-06-06: qty 30, 30d supply, fill #0

## 2024-06-06 NOTE — Patient Instructions (Signed)
 Health Maintenance, Male  Adopting a healthy lifestyle and getting preventive care are important in promoting health and wellness. Ask your health care provider about:  The right schedule for you to have regular tests and exams.  Things you can do on your own to prevent diseases and keep yourself healthy.  What should I know about diet, weight, and exercise?  Eat a healthy diet    Eat a diet that includes plenty of vegetables, fruits, low-fat dairy products, and lean protein.  Do not eat a lot of foods that are high in solid fats, added sugars, or sodium.  Maintain a healthy weight  Body mass index (BMI) is a measurement that can be used to identify possible weight problems. It estimates body fat based on height and weight. Your health care provider can help determine your BMI and help you achieve or maintain a healthy weight.  Get regular exercise  Get regular exercise. This is one of the most important things you can do for your health. Most adults should:  Exercise for at least 150 minutes each week. The exercise should increase your heart rate and make you sweat (moderate-intensity exercise).  Do strengthening exercises at least twice a week. This is in addition to the moderate-intensity exercise.  Spend less time sitting. Even light physical activity can be beneficial.  Watch cholesterol and blood lipids  Have your blood tested for lipids and cholesterol at 64 years of age, then have this test every 5 years.  You may need to have your cholesterol levels checked more often if:  Your lipid or cholesterol levels are high.  You are older than 64 years of age.  You are at high risk for heart disease.  What should I know about cancer screening?  Many types of cancers can be detected early and may often be prevented. Depending on your health history and family history, you may need to have cancer screening at various ages. This may include screening for:  Colorectal cancer.  Prostate cancer.  Skin cancer.  Lung  cancer.  What should I know about heart disease, diabetes, and high blood pressure?  Blood pressure and heart disease  High blood pressure causes heart disease and increases the risk of stroke. This is more likely to develop in people who have high blood pressure readings or are overweight.  Talk with your health care provider about your target blood pressure readings.  Have your blood pressure checked:  Every 3-5 years if you are 24-52 years of age.  Every year if you are 3 years old or older.  If you are between the ages of 60 and 72 and are a current or former smoker, ask your health care provider if you should have a one-time screening for abdominal aortic aneurysm (AAA).  Diabetes  Have regular diabetes screenings. This checks your fasting blood sugar level. Have the screening done:  Once every three years after age 66 if you are at a normal weight and have a low risk for diabetes.  More often and at a younger age if you are overweight or have a high risk for diabetes.  What should I know about preventing infection?  Hepatitis B  If you have a higher risk for hepatitis B, you should be screened for this virus. Talk with your health care provider to find out if you are at risk for hepatitis B infection.  Hepatitis C  Blood testing is recommended for:  Everyone born from [birth year] through 1966.  Anyone  with known risk factors for hepatitis C.  Sexually transmitted infections (STIs)  You should be screened each year for STIs, including gonorrhea and chlamydia, if:  You are sexually active and are younger than 64 years of age.  You are older than 64 years of age and your health care provider tells you that you are at risk for this type of infection.  Your sexual activity has changed since you were last screened, and you are at increased risk for chlamydia or gonorrhea. Ask your health care provider if you are at risk.  Ask your health care provider about whether you are at high risk for HIV. Your health care provider  may recommend a prescription medicine to help prevent HIV infection. If you choose to take medicine to prevent HIV, you should first get tested for HIV. You should then be tested every 3 months for as long as you are taking the medicine.  Follow these instructions at home:  Alcohol use  Do not drink alcohol if your health care provider tells you not to drink.  If you drink alcohol:  Limit how much you have to 0-2 drinks a day.  Know how much alcohol is in your drink. In the [place] one drink equals one 12 oz bottle of beer (355 mL), one 5 oz glass of wine (148 mL), or one 1 oz glass of hard liquor (44 mL).  Lifestyle  Do not use any products that contain nicotine or tobacco. These products include cigarettes, chewing tobacco, and vaping devices, such as e-cigarettes. If you need help quitting, ask your health care provider.  Do not use street drugs.  Do not share needles.  Ask your health care provider for help if you need support or information about quitting drugs.  General instructions  Schedule regular health, dental, and eye exams.  Stay current with your vaccines.  Tell your health care provider if:  You often feel depressed.  You have ever been abused or do not feel safe at home.  Summary  Adopting a healthy lifestyle and getting preventive care are important in promoting health and wellness.  Follow your health care provider's instructions about healthy diet, exercising, and getting tested or screened for diseases.  Follow your health care provider's instructions on monitoring your cholesterol and blood pressure.  This information is not intended to replace advice given to you by your health care provider. Make sure you discuss any questions you have with your health care provider.  Document Revised: 02/24/2021 Document Reviewed: 02/24/2021  Elsevier Patient Education  2024 [employer].

## 2024-06-07 LAB — CBC WITH DIFFERENTIAL/PLATELET
Basophils Absolute: 0 x10E3/uL (ref 0.0–0.2)
Basos: 1 %
EOS (ABSOLUTE): 0.1 x10E3/uL (ref 0.0–0.4)
Eos: 2 %
Hematocrit: 46 % (ref 37.5–51.0)
Hemoglobin: 15.6 g/dL (ref 13.0–17.7)
Immature Grans (Abs): 0 x10E3/uL (ref 0.0–0.1)
Immature Granulocytes: 0 %
Lymphocytes Absolute: 1.9 x10E3/uL (ref 0.7–3.1)
Lymphs: 33 %
MCH: 33.5 pg — ABNORMAL HIGH (ref 26.6–33.0)
MCHC: 33.9 g/dL (ref 31.5–35.7)
MCV: 99 fL — ABNORMAL HIGH (ref 79–97)
Monocytes Absolute: 0.5 x10E3/uL (ref 0.1–0.9)
Monocytes: 9 %
Neutrophils Absolute: 3.2 x10E3/uL (ref 1.4–7.0)
Neutrophils: 55 %
Platelets: 169 x10E3/uL (ref 150–450)
RBC: 4.66 x10E6/uL (ref 4.14–5.80)
RDW: 12.5 % (ref 11.6–15.4)
WBC: 5.9 x10E3/uL (ref 3.4–10.8)

## 2024-06-07 LAB — CMP14+EGFR
ALT: 52 IU/L — ABNORMAL HIGH (ref 0–44)
AST: 36 IU/L (ref 0–40)
Albumin: 4.5 g/dL (ref 3.9–4.9)
Alkaline Phosphatase: 81 IU/L (ref 44–121)
BUN/Creatinine Ratio: 21 (ref 10–24)
BUN: 19 mg/dL (ref 8–27)
Bilirubin Total: 0.7 mg/dL (ref 0.0–1.2)
CO2: 22 mmol/L (ref 20–29)
Calcium: 9.5 mg/dL (ref 8.6–10.2)
Chloride: 104 mmol/L (ref 96–106)
Creatinine, Ser: 0.91 mg/dL (ref 0.76–1.27)
Globulin, Total: 2.2 g/dL (ref 1.5–4.5)
Glucose: 92 mg/dL (ref 70–99)
Potassium: 4.3 mmol/L (ref 3.5–5.2)
Sodium: 141 mmol/L (ref 134–144)
Total Protein: 6.7 g/dL (ref 6.0–8.5)
eGFR: 94 mL/min/1.73 (ref >59)

## 2024-06-07 LAB — VITAMIN D 25 HYDROXY (VIT D DEFICIENCY, FRACTURES): Vit D, 25-Hydroxy: 26.4 ng/mL — ABNORMAL LOW (ref 30.0–100.0)

## 2024-06-07 LAB — PSA, TOTAL AND FREE
PSA, Free Pct: 30 %
PSA, Free: 0.12 ng/mL
Prostate Specific Ag, Serum: 0.4 ng/mL (ref 0.0–4.0)

## 2024-06-07 LAB — TSH: TSH: 1.17 u[IU]/mL (ref 0.450–4.500)

## 2024-06-07 LAB — LIPID PANEL
Chol/HDL Ratio: 5 ratio (ref 0.0–5.0)
Cholesterol, Total: 208 mg/dL — ABNORMAL HIGH (ref 100–199)
HDL: 42 mg/dL (ref >39)
LDL Chol Calc (NIH): 127 mg/dL — ABNORMAL HIGH (ref 0–99)
Triglycerides: 222 mg/dL — ABNORMAL HIGH (ref 0–149)
VLDL Cholesterol Cal: 39 mg/dL (ref 5–40)

## 2024-06-09 ENCOUNTER — Ambulatory Visit: Payer: Self-pay | Admitting: Physician Assistant

## 2024-06-09 DIAGNOSIS — E785 Hyperlipidemia, unspecified: Secondary | ICD-10-CM

## 2024-06-09 NOTE — Progress Notes (Signed)
 Raad,   Vitamin D  low. You need to start taking 1000 units of vitamin D3 a day.  Thyroid  looks good.  Prostate looks good.  ALT is a little elevated. Do you drink alcohol? Are you taking tylenol  regularly?  Recheck in 2 weeks LDL up, thoughts on increasing crestor  to 20mg  daily?   SABRASABRAThe 10-year ASCVD risk score (Arnett DK, et al., 2019) is: 17.9%   Values used to calculate the score:     Age: 63 years     Clincally relevant sex: Male     Is Non-Hispanic African American: No     Diabetic: No     Tobacco smoker: No     Systolic Blood Pressure: 139 mmHg     Is BP treated: Yes     HDL Cholesterol: 42 mg/dL     Total Cholesterol: 208 mg/dL

## 2024-06-12 ENCOUNTER — Encounter: Payer: Self-pay | Admitting: Physician Assistant

## 2024-06-12 DIAGNOSIS — G8929 Other chronic pain: Secondary | ICD-10-CM | POA: Insufficient documentation

## 2024-06-12 NOTE — Progress Notes (Signed)
 Complete physical exam  Patient: Juan Holt   DOB: 1960/12/26   63 y.o. Male  MRN: [id number]  Subjective:    Chief Complaint  Patient presents with   Abdominal Pain    Juan Holt is a 63 y.o. male who presents today for a complete physical exam. He reports consuming a general diet. The patient has a physically strenuous job, but has no regular exercise apart from work.  He generally feels fairly well. He reports sleeping fairly well. He does have additional problems to discuss today.   He would like to try something for ED as needed. He borrowed a pill from a friend once but has never had a prescription himself.    Most recent fall risk assessment:    03/08/2024    8:45 AM  Fall Risk   Falls in the past year? 0  Number falls in past yr: 0  Injury with Fall? 0  Risk for fall due to : No Fall Risks  Follow up Falls evaluation completed     Most recent depression screenings:    06/06/2024   11:52 AM 03/08/2024    8:45 AM  PHQ 2/9 Scores  PHQ - 2 Score 0 0  PHQ- 9 Score 2     Vision:Within last year, Dental: No current dental problems and Receives regular dental care, and PSA: Agrees to PSA testing  Patient Active Problem List   Diagnosis Date Noted   Chronic left-sided low back pain without sciatica 06/12/2024   Erectile dysfunction 06/06/2024   DDD (degenerative disc disease), lumbar 03/17/2024   Hemorrhoids 04/28/2023   Metatarsalgia of both feet 04/28/2023   Arthralgia of multiple joints 04/28/2023   Indigestion 04/28/2023   Ventral hernia without obstruction or gangrene 04/28/2023   Ascending aortic aneurysm (HCC) 09/23/2022   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/21/2022   Low testosterone  in male 04/29/2021   Gastroesophageal reflux disease with esophagitis without hemorrhage 04/29/2021   Hypertriglyceridemia 04/24/2020   Vitamin D  insufficiency 04/24/2020   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   Benign  prostatic hyperplasia with urinary frequency 04/24/2020   Chronic fatigue 04/24/2020   Left hip pain 04/24/2020   Acute meniscal tear of left knee 04/24/2020   OSA (obstructive sleep apnea) 04/23/2020   Corn of toe 01/08/2020   Hand injury, left, initial encounter 12/19/2019   Right wrist injury 11/16/2019   Muscle cramps 10/30/2019   Reactive hypoglycemia 10/30/2019   Hyperlipidemia LDL goal <100 10/30/2019   Bilateral thumb pain 10/30/2019   Past Medical History:  Diagnosis Date   Hypoglycemia    Past Surgical History:  Procedure Laterality Date   VASECTOMY     Family History  Problem Relation Age of Onset   Cancer Father        small cell   Allergies  Allergen Reactions   Prednisone Other (See Comments)    Severe altered mental status      Patient Care Team: Nicholas Trompeter L, PA-C as PCP - General (Family Medicine) [place], Peter M, MD as Consulting Physician (Cardiology)   Outpatient Medications Prior to Visit  Medication Sig   hydrocortisone  (ANUSOL -HC) 2.5 % rectal cream Place 1 Application rectally 2 (two) times daily.   losartan -hydrochlorothiazide  (HYZAAR ) 100-25 MG tablet Take 1 tablet by mouth daily.   omeprazole  (PRILOSEC) 40 MG capsule Take 1 capsule (40 mg total) by mouth daily.   orphenadrine  (NORFLEX ) 100 MG tablet Take 1 tablet (100 mg total) by mouth  2 (two) times daily.   rosuvastatin  (CRESTOR ) 10 MG tablet Take 1 tablet (10 mg total) by mouth daily.   [DISCONTINUED] meloxicam  (MOBIC ) 15 MG tablet Take 1 tablet (15 mg total) by mouth daily.   [DISCONTINUED] metoprolol  tartrate (LOPRESSOR ) 25 MG tablet Take 1 tablet ( 25 mg) by mouth  2 hours before Coronary CT.   No facility-administered medications prior to visit.    Review of Systems  All other systems reviewed and are negative.         Objective:     BP 139/75   Pulse (!) 59   Ht 6' (1.829 m)   Wt 212 lb (96.2 kg)   SpO2 96%   BMI 28.75 kg/m  BP Readings from Last 3 Encounters:   06/06/24 139/75  04/28/24 (!) 158/89  03/08/24 130/81   Wt Readings from Last 3 Encounters:  06/06/24 212 lb (96.2 kg)  03/08/24 212 lb 9.6 oz (96.4 kg)  08/23/23 206 lb (93.4 kg)      Physical Exam  BP 139/75   Pulse (!) 59   Ht 6' (1.829 m)   Wt 212 lb (96.2 kg)   SpO2 96%   BMI 28.75 kg/m   General Appearance:    Alert, cooperative, no distress, appears stated age  Head:    Normocephalic, without obvious abnormality, atraumatic  Eyes:    PERRL, conjunctiva/corneas clear, EOM's intact, fundi    benign, both eyes       Ears:    Normal TM's and external ear canals, both ears  Nose:   Nares normal, septum midline, mucosa normal, no drainage    or sinus tenderness  Throat:   Lips, mucosa, and tongue normal; teeth and gums normal  Neck:   Supple, symmetrical, trachea midline, no adenopathy;       thyroid :  No enlargement/tenderness/nodules; no carotid   bruit or JVD  Back:     Symmetric, no curvature, ROM normal, no CVA tenderness  Lungs:     Clear to auscultation bilaterally, respirations unlabored  Chest wall:    No tenderness or deformity  Heart:    Regular rate and rhythm, S1 and S2 normal, no murmur, rub   or gallop  Abdomen:     Soft, non-tender, bowel sounds active all four quadrants,    no masses, no organomegaly, ventral hernia        Extremities:   Extremities normal, atraumatic, no cyanosis or edema  Pulses:   2+ and symmetric all extremities  Skin:   Skin color, texture, turgor normal, no rashes or lesions  Lymph nodes:   Cervical, supraclavicular, and axillary nodes normal  Neurologic:   CNII-XII intact. Normal strength, sensation and reflexes      throughout       Assessment & Plan:    Routine Health Maintenance and Physical Exam  Immunization History  Administered Date(s) Administered   Tdap 03/06/2014, 10/19/2016    Health Maintenance  Topic Date Due   Zoster Vaccines- Shingrix (1 of 2) Never done   COVID-19 Vaccine (1 - 2024-25 season)  06/22/2024 (Originally 06/20/2023)   INFLUENZA VACCINE  01/16/2025 (Originally 05/19/2024)   Pneumococcal Vaccine: 50+ Years (1 of 1 - PCV) 06/06/2025 (Originally 11/03/2009)   Colonoscopy  10/26/2025   DTaP/Tdap/Td (3 - Td or Tdap) 10/19/2026   Hepatitis C Screening  Completed   HIV Screening  Completed   Hepatitis B Vaccines 19-59 Average Risk  Aged Out   HPV VACCINES  Aged Out  Meningococcal B Vaccine  Aged Out    Discussed health benefits of physical activity, and encouraged him to engage in regular exercise appropriate for his age and condition.  SABRAMardene was seen today for abdominal pain.  Diagnoses and all orders for this visit:  Routine physical examination -     CBC with Differential/Platelet -     CMP14+EGFR -     Lipid panel -     PSA, total and free -     TSH -     VITAMIN D  25 Hydroxy (Vit-D Deficiency, Fractures)  Hyperlipidemia LDL goal <100 -     Lipid panel  OSA (obstructive sleep apnea)  Hypertriglyceridemia -     Lipid panel  Vitamin D  insufficiency -     VITAMIN D  25 Hydroxy (Vit-D Deficiency, Fractures)  Screening for diabetes mellitus -     CMP14+EGFR  Erectile dysfunction, unspecified erectile dysfunction type -     sildenafil  (VIAGRA ) 100 MG tablet; Take 1 tablet (100 mg total) by mouth as needed for erectile dysfunction.  Chronic left-sided low back pain without sciatica -     meloxicam  (MOBIC ) 15 MG tablet; Take 1 tablet (15 mg total) by mouth daily.  SABRA.Start a regular exercise program and make sure you are eating a healthy diet Try to eat 4 servings of dairy a day or take a calcium  supplement (500mg  twice a day). Fasting labs ordered No falls PHQ no concerns Not wearing CPAP at night and does not want to wear it Colonoscopy UTD Declined all vaccines today Discussed ED, trial of viagra  as needed Refilled mobic  for chronic low back pain  Return in about 6 months (around 12/07/2024).     Aqeel Norgaard, PA-C

## 2024-07-11 DIAGNOSIS — H524 Presbyopia: Secondary | ICD-10-CM | POA: Diagnosis not present

## 2024-09-05 ENCOUNTER — Ambulatory Visit

## 2024-09-05 ENCOUNTER — Ambulatory Visit
Admission: RE | Admit: 2024-09-05 | Discharge: 2024-09-05 | Disposition: A | Discharge status: Home / Self Care | Attending: Family Medicine | Admitting: Family Medicine

## 2024-09-05 VITALS — BP 138/85 | HR 57 | Temp 98.2°F | Resp 18 | Wt 204.0 lb

## 2024-09-05 DIAGNOSIS — M25512 Pain in left shoulder: Secondary | ICD-10-CM

## 2024-09-05 DIAGNOSIS — S4992XA Unspecified injury of left shoulder and upper arm, initial encounter: Secondary | ICD-10-CM | POA: Diagnosis not present

## 2024-09-05 NOTE — ED Provider Notes (Signed)
 [facility]    CSN: [id number] Arrival date & time: 09/05/24  1728      History   Chief Complaint Chief Complaint  Patient presents with   Shoulder Pain    Entered by patient    HPI Juan Holt is a 63 y.o. male.   Patient works in an doctor, hospital.  He uses his arms extensively in order to do his job.  He usually is able to do this successfully.  For the last 2 nights his left shoulder has been painful when he tries to sleep.  He has had to reposition a couple times.  It was not severe.  Today while doing his job he picked up a heavy object and felt a pop and sudden severe pain in his shoulder.  His shoulder has been severely painful ever since that time.  He cannot movement more than a few degrees without severe pain.  He has never had this before.    Past Medical History:  Diagnosis Date   Hypoglycemia     Patient Active Problem List   Diagnosis Date Noted   Chronic left-sided low back pain without sciatica 06/12/2024   Erectile dysfunction 06/06/2024   DDD (degenerative disc disease), lumbar 03/17/2024   Hemorrhoids 04/28/2023   Metatarsalgia of both feet 04/28/2023   Arthralgia of multiple joints 04/28/2023   Indigestion 04/28/2023   Ventral hernia without obstruction or gangrene 04/28/2023   Ascending aortic aneurysm 09/23/2022   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/21/2022   Low testosterone  in male 04/29/2021   Gastroesophageal reflux disease with esophagitis without hemorrhage 04/29/2021   Hypertriglyceridemia 04/24/2020   Vitamin D  insufficiency 04/24/2020   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   Benign prostatic hyperplasia with urinary frequency 04/24/2020   Chronic fatigue 04/24/2020   Left hip pain 04/24/2020   Acute meniscal tear of left knee 04/24/2020   OSA (obstructive sleep apnea) 04/23/2020   Corn of toe 01/08/2020   Hand injury, left, initial encounter 12/19/2019   Right  wrist injury 11/16/2019   Muscle cramps 10/30/2019   Reactive hypoglycemia 10/30/2019   Hyperlipidemia LDL goal <100 10/30/2019   Bilateral thumb pain 10/30/2019    Past Surgical History:  Procedure Laterality Date   VASECTOMY         Home Medications    Prior to Admission medications   Medication Sig Start Date End Date Taking? Authorizing Provider  hydrocortisone  (ANUSOL -HC) 2.5 % rectal cream Place 1 Application rectally 2 (two) times daily. 04/28/23   Breeback, Jade L, PA-C  losartan -hydrochlorothiazide  (HYZAAR ) 100-25 MG tablet Take 1 tablet by mouth daily. 02/18/24   Jordan, Peter M, MD  meloxicam  (MOBIC ) 15 MG tablet Take 1 tablet (15 mg total) by mouth daily. 06/06/24   Breeback, Jade L, PA-C  omeprazole  (PRILOSEC) 40 MG capsule Take 1 capsule (40 mg total) by mouth daily. 04/28/23   Breeback, Jade L, PA-C  orphenadrine  (NORFLEX ) 100 MG tablet Take 1 tablet (100 mg total) by mouth 2 (two) times daily. 03/08/24   Breeback, Jade L, PA-C  rosuvastatin  (CRESTOR ) 10 MG tablet Take 1 tablet (10 mg total) by mouth daily. 03/08/24 06/07/24  Breeback, Jade L, PA-C  sildenafil  (VIAGRA ) 100 MG tablet Take 1 tablet (100 mg total) by mouth as needed for erectile dysfunction. 06/06/24 06/06/25  Breeback, Jade L, PA-C    Family History Family History  Problem Relation Age of Onset   Cancer Father  small cell    Social History Social History   Tobacco Use   Smoking status: Never   Smokeless tobacco: Never  Vaping Use   Vaping status: Never Used  Substance Use Topics   Alcohol use: Yes    Comment: 2 beers per week   Drug use: Never     Allergies   Prednisone   Review of Systems Review of Systems See HPI  Physical Exam Triage Vital Signs ED Triage Vitals  Encounter Vitals Group     BP 09/05/24 1746 138/85     Girls Systolic BP Percentile --      Girls Diastolic BP Percentile --      Boys Systolic BP Percentile --      Boys Diastolic BP Percentile --      Pulse  Rate 09/05/24 1746 (!) 57     Resp 09/05/24 1746 18     Temp 09/05/24 1746 98.2 F (36.8 C)     Temp Source 09/05/24 1746 Oral     SpO2 09/05/24 1746 96 %     Weight 09/05/24 1744 204 lb (92.5 kg)     Height --      Head Circumference --      Peak Flow --      Pain Score 09/05/24 1744 10     Pain Loc --      Pain Education --      Exclude from Growth Chart --    No data found.  Updated Vital Signs BP 138/85 (BP Location: Right Arm)   Pulse (!) 57   Temp 98.2 F (36.8 C) (Oral)   Resp 18   Wt 92.5 kg   SpO2 96%   BMI 27.67 kg/m       Physical Exam Constitutional:      General: He is in acute distress.     Appearance: He is well-developed and normal weight.     Comments: Acutely uncomfortable.  Holds left arm up close to body  HENT:     Head: Normocephalic and atraumatic.  Eyes:     Conjunctiva/sclera: Conjunctivae normal.     Pupils: Pupils are equal, round, and reactive to light.  Cardiovascular:     Rate and Rhythm: Normal rate.  Pulmonary:     Effort: Pulmonary effort is normal. No respiratory distress.  Abdominal:     General: There is no distension.     Palpations: Abdomen is soft.  Musculoskeletal:        General: Swelling, tenderness and signs of injury present. No deformity. Normal range of motion.     Cervical back: Normal range of motion.     Comments: Tenderness in the anterior and lateral shoulder joint region.  Mild increased muscle tone in the left upper trapezius and neck muscles.  Patient will move her shoulder only a few degrees in any direction expressing severe pain.  No visible palpable deformity in the bicep  Skin:    General: Skin is warm and dry.  Neurological:     General: No focal deficit present.     Mental Status: He is alert.      UC Treatments / Results  Labs (all labs ordered are listed, but only abnormal results are displayed) Labs Reviewed - No data to display  EKG   Radiology DG Shoulder Left Result Date:  09/05/2024 EXAM: 1 VIEW(S) XRAY OF THE LEFT SHOULDER 09/05/2024 06:08:00 PM COMPARISON: None available. CLINICAL HISTORY: severe pain sudden today FINDINGS: BONES AND JOINTS: Glenohumeral joint  is normally aligned. No acute fracture or dislocation. The [facility] joint is unremarkable in appearance. SOFT TISSUES: No abnormal calcifications. Visualized lung is unremarkable. IMPRESSION: 1. No acute osseous or soft tissue abnormality. Electronically signed by: Morgane Naveau MD 09/05/2024 06:28 PM EST RP Workstation: [id number]    Procedures Procedures (including critical care time)  Medications Ordered in UC Medications - No data to display  Initial Impression / Assessment and Plan / UC Course  I have reviewed the triage vital signs and the nursing notes.  Pertinent labs & imaging results that were available during my care of the patient were reviewed by me and considered in my medical decision making (see chart for details).     Ice, ibuprofen, immobilization.  Pain medicine declined.  Steroids declined. I did send a message to Dr. Sharlene to try to get an appointment this week I believe he is going to need advanced imaging Final Clinical Impressions(s) / UC Diagnoses   Final diagnoses:  Shoulder joint painful on movement, left  Shoulder injury, left, initial encounter     Discharge Instructions      Take 800 mg of ibuprofen with your home.  Make sure you take this with some food Put ice pack on your shoulder for 20 minutes out of every couple hours for the rest of the evening Wear sling and limit use of your arm  See Dr. Sharlene in follow-up in sports medicine     ED Prescriptions   None    PDMP not reviewed this encounter.   Maranda Jamee Jacob, MD 09/05/24 [phone]

## 2024-09-05 NOTE — Discharge Instructions (Signed)
 Take 800 mg of ibuprofen with your home.  Make sure you take this with some food Put ice pack on your shoulder for 20 minutes out of every couple hours for the rest of the evening Wear sling and limit use of your arm  See Dr. Sharlene in follow-up in sports medicine

## 2024-09-05 NOTE — ED Triage Notes (Signed)
 Patient states left shoulder pain that started a few days ago but significantly worse since 4p when he lifted a bumper.  10 of 10 pain, holding arm straight down and close to him, states he can't shrug left shoulder.

## 2024-09-07 NOTE — ED Notes (Signed)
[place]

## 2024-12-08 ENCOUNTER — Ambulatory Visit: Admitting: Physician Assistant
